# Patient Record
Sex: Female | Born: 1940 | Race: White | Hispanic: No | Smoking: Former smoker
Health system: Southern US, Community
[De-identification: ages and names within clinical notes are randomized; demographics above are authoritative.]

## PROBLEM LIST (undated history)

## (undated) ENCOUNTER — Emergency Department: Admission: EM | Disposition: A | Discharge status: Home / Self Care | Payer: Medicare Other | Source: Home / Self Care

## (undated) DIAGNOSIS — K219 Gastro-esophageal reflux disease without esophagitis: Secondary | ICD-10-CM

## (undated) DIAGNOSIS — D649 Anemia, unspecified: Secondary | ICD-10-CM

## (undated) DIAGNOSIS — M79606 Pain in leg, unspecified: Secondary | ICD-10-CM

## (undated) DIAGNOSIS — I6529 Occlusion and stenosis of unspecified carotid artery: Secondary | ICD-10-CM

## (undated) DIAGNOSIS — F419 Anxiety disorder, unspecified: Secondary | ICD-10-CM

## (undated) DIAGNOSIS — I639 Cerebral infarction, unspecified: Secondary | ICD-10-CM

## (undated) DIAGNOSIS — M1990 Unspecified osteoarthritis, unspecified site: Secondary | ICD-10-CM

## (undated) DIAGNOSIS — I8390 Asymptomatic varicose veins of unspecified lower extremity: Secondary | ICD-10-CM

## (undated) DIAGNOSIS — E785 Hyperlipidemia, unspecified: Secondary | ICD-10-CM

## (undated) HISTORY — DX: Pain in leg, unspecified: M79.606

## (undated) HISTORY — DX: Occlusion and stenosis of unspecified carotid artery: I65.29

## (undated) HISTORY — PX: CHOLECYSTECTOMY: SHX55

## (undated) HISTORY — DX: Gastro-esophageal reflux disease without esophagitis: K21.9

## (undated) HISTORY — DX: Unspecified osteoarthritis, unspecified site: M19.90

## (undated) HISTORY — DX: Asymptomatic varicose veins of unspecified lower extremity: I83.90

## (undated) HISTORY — DX: Hyperlipidemia, unspecified: E78.5

## (undated) HISTORY — DX: Cerebral infarction, unspecified: I63.9

## (undated) HISTORY — DX: Anxiety disorder, unspecified: F41.9

## (undated) HISTORY — DX: Anemia, unspecified: D64.9

---

## 1998-04-28 ENCOUNTER — Emergency Department (HOSPITAL_COMMUNITY): Admission: EM | Admit: 1998-04-28 | Discharge: 1998-04-28 | Payer: Self-pay

## 2008-07-22 ENCOUNTER — Ambulatory Visit: Payer: Self-pay | Admitting: Surgery

## 2008-07-26 ENCOUNTER — Ambulatory Visit: Payer: Self-pay | Admitting: Surgery

## 2008-07-26 ENCOUNTER — Inpatient Hospital Stay (HOSPITAL_COMMUNITY): Admission: RE | Admit: 2008-07-26 | Discharge: 2008-07-27 | Payer: Self-pay | Admitting: Surgery

## 2008-07-26 ENCOUNTER — Encounter: Payer: Self-pay | Admitting: Surgery

## 2008-07-26 HISTORY — PX: CAROTID ENDARTERECTOMY: SUR193

## 2008-08-26 ENCOUNTER — Ambulatory Visit: Payer: Self-pay | Admitting: Surgery

## 2009-03-10 ENCOUNTER — Ambulatory Visit: Payer: Self-pay | Admitting: Surgery

## 2009-06-16 ENCOUNTER — Emergency Department (HOSPITAL_COMMUNITY): Admission: EM | Admit: 2009-06-16 | Discharge: 2009-06-16 | Payer: Self-pay | Admitting: Emergency Medicine

## 2010-07-20 ENCOUNTER — Ambulatory Visit: Payer: Self-pay | Admitting: Surgery

## 2010-11-04 ENCOUNTER — Encounter: Payer: Self-pay | Admitting: Vascular Surgery

## 2010-12-02 ENCOUNTER — Encounter (INDEPENDENT_AMBULATORY_CARE_PROVIDER_SITE_OTHER): Payer: Medicare Other | Admitting: Vascular Surgery

## 2010-12-02 ENCOUNTER — Encounter (INDEPENDENT_AMBULATORY_CARE_PROVIDER_SITE_OTHER): Payer: Medicare Other

## 2010-12-02 DIAGNOSIS — I83893 Varicose veins of bilateral lower extremities with other complications: Secondary | ICD-10-CM

## 2010-12-03 NOTE — Assessment & Plan Note (Signed)
OFFICE VISIT  [name], [name] DOB:  30-Jan-1941                                       12/02/2010 WUXLK#:[id number]  [name] presents today for evaluation of right leg venous hypertension.  This is a very pleasant 70 year old white female with progressively severe venous varicosities and pain in her right leg over the last several years.  She is known to our practice from a prior carotid endarterectomy by Dr. Durene Cal.  She has been wearing thigh high graduated compression garments since October 2011.  She reports that she has had no improvement in her symptoms.  She does report aching and pain with prolonged standing most over the veins in her medial thigh and calf and also a heavy sensation in her leg below the knee in general.  She does have swelling at the level of her right ankle and calf.  She does not have a history of DVT or superficial thrombophlebitis.  She does have no relief with elevation.  She does have leg  discomfort.  PAST MEDICAL HISTORY:  Significant for hypertension and diabetes.  SOCIAL HISTORY:  She is married with 4 children.  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  REVIEW OF SYSTEMS:  No weight loss or gain.  She is 5 feet tall.  She weighs 150 pounds.  She does have a history of asthma and has history of arthritis, joint pain and muscle pain.  Review of systems otherwise negative.  PHYSICAL EXAMINATION:  Well-developed, well-nourished white female appearing her stated age in no acute distress. Blood pressure is 132/76, pulse 90, respirations 18. HEENT:  Normal. Her radial pulses are 2+.  She has 2+ dorsalis pedis pulses bilaterally. MUSCULOSKELETAL:  Shows no deformity or cyanosis. NEUROLOGIC:  No focal weakness or paresthesias. SKIN:  Without ulcers or rashes.  She does have marked venous varicosities throughout her right thigh and medial calf.  She underwent a formal duplex today in our office and  this reveals gross reflux throughout her right great saphenous vein and significant reflux of her deep system.  These do extend into the varicosities.  I discussed this at length with [name] and her husband present.  I have recommended laser ablation of her right great saphenous vein for reduction of her venous hypertension.  She understands that this is an outpatient procedure under local anesthesia.  She understands that she may require a second stage procedure for the varicosities if she does not have relief with the ablation of her saphenous vein and she wish to proceed.  We will schedule this at her earliest convenience.    Larina Earthly, M.D. Electronically Signed  TFE/MEDQ  D:  12/02/2010  T:  12/03/2010  Job:  [id number]

## 2010-12-09 NOTE — Procedures (Unsigned)
LOWER EXTREMITY VENOUS REFLUX EXAM  INDICATION:  Varicose veins.  EXAM:  Using color-flow imaging and pulse Doppler spectral analysis, the right and left common femoral, superficial femoral, popliteal, posterior tibial, greater and lesser saphenous veins are evaluated.  There is evidence suggesting deep venous insufficiency in the right and left lower extremities.  The right and left saphenofemoral junctions are not competent with Reflux of >500 milliseconds. The right GSV is not competent with Reflux of >500 milliseconds with the caliber as described below.  The left GSV is competent.  The right and left proximal short saphenous veins demonstrate competency.  GSV Diameter (used if found to be incompetent only)                                           Right    Left Proximal Greater Saphenous Vein           1.03 cm  0.59 cm Proximal-to-mid-thigh                     0.66 cm Mid thigh                                 0.60 cm Mid-distal thigh Distal thigh                              0.26 cm Knee   IMPRESSION: 1. Right greater saphenous vein is not competent with reflux     >561milliseconds. 2. The left greater saphenous vein is competent. 3. The right and left great saphenous veins are not tortuous. 4. The deep venous system is not competent with Reflux of     >515milliseconds. 5. The right and left small saphenous veins are competent.        ___________________________________________ Larina Earthly, M.D.  OD/MEDQ  D:  12/03/2010  T:  12/03/2010  Job:  [id number]

## 2010-12-16 ENCOUNTER — Other Ambulatory Visit (INDEPENDENT_AMBULATORY_CARE_PROVIDER_SITE_OTHER): Payer: Medicare Other | Admitting: Vascular Surgery

## 2010-12-16 DIAGNOSIS — I83893 Varicose veins of bilateral lower extremities with other complications: Secondary | ICD-10-CM

## 2010-12-17 NOTE — Assessment & Plan Note (Signed)
OFFICE VISIT  [name], [name] DOB:  [birth year]                                       12/16/2010 ZOXWR#:[id number]  The patient underwent laser ablation of her right great saphenous vein from mid thigh to just below the saphenofemoral junction.  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  This was done under tumescent anesthesia with no immediate complications.  She was discharged to home and will be seen again in 1 week for follow-up duplex.    Larina Earthly, M.D. Electronically Signed  TFE/MEDQ  D:  12/16/2010  T:  12/17/2010  Job:  [id number]

## 2010-12-23 ENCOUNTER — Ambulatory Visit (INDEPENDENT_AMBULATORY_CARE_PROVIDER_SITE_OTHER): Payer: Medicare Other | Admitting: Vascular Surgery

## 2010-12-23 ENCOUNTER — Encounter (INDEPENDENT_AMBULATORY_CARE_PROVIDER_SITE_OTHER): Payer: Medicare Other

## 2010-12-23 DIAGNOSIS — I87399 Chronic venous hypertension (idiopathic) with other complications of unspecified lower extremity: Secondary | ICD-10-CM

## 2010-12-23 DIAGNOSIS — I83893 Varicose veins of bilateral lower extremities with other complications: Secondary | ICD-10-CM

## 2010-12-23 DIAGNOSIS — Z48812 Encounter for surgical aftercare following surgery on the circulatory system: Secondary | ICD-10-CM

## 2010-12-24 NOTE — Assessment & Plan Note (Signed)
OFFICE VISIT  [name], WESTGATE DOB:  June 11, 1941                                       12/23/2010 [id number]#:[id number]  Patient presents today for 1-week followup of her right great saphenous vein laser ablation.  She has the usual mild discomfort over the ablation site in the medial thigh, has had no increased swelling and no other difficulties.  PHYSICAL EXAMINATION:  Blood pressure 137/80, pulse 93, respirations 20. HEENT is normal.  Musculoskeletal:  No major deformities or cyanosis. Neurologic:  No focal weakness, no paresthesias.  Skin without ulcers or rashes.  She does have some thickening in the right medial thigh over the ablation site.  She does have a palpable dorsalis pedis pulse.  She then underwent noninvasive vascular laboratory studies in our office, which I have reviewed with her.  This shows no evidence of DVT and does show ablation over her great saphenous vein from the distal insertion site to just below the saphenofemoral junction.  I am quite pleased with her Jagger Demonte result, as is patient.  She certainly has had decompression of the large varices that she had in her medial thigh and calf.  She will continue use of her compression garment as needed.  We will see her again in 3 months for continued discussion. She understands that she may require a separate staged phlebectomy of tributaries should she continue to have discomfort related to this.    Larina Earthly, M.D. Electronically Signed  TFE/MEDQ  D:  12/23/2010  T:  12/24/2010  Job:  [id number]  cc:   Dr. Blane Ohara

## 2010-12-25 LAB — CBC
HCT: 36.3 % (ref 36.0–46.0)
Hemoglobin: 12.1 g/dL (ref 12.0–15.0)
MCHC: 33.5 g/dL (ref 30.0–36.0)
Platelets: 395 10*3/uL (ref 150–400)
RDW: 13.2 % (ref 11.5–15.5)

## 2010-12-25 LAB — DIFFERENTIAL
Basophils Absolute: 0.1 10*3/uL (ref 0.0–0.1)
Basophils Relative: 1 % (ref 0–1)
Eosinophils Relative: 3 % (ref 0–5)
Lymphocytes Relative: 28 % (ref 12–46)
Lymphs Abs: 2.7 10*3/uL (ref 0.7–4.0)
Monocytes Absolute: 0.5 10*3/uL (ref 0.1–1.0)
Neutro Abs: 6.2 10*3/uL (ref 1.7–7.7)
Neutrophils Relative %: 63 % (ref 43–77)

## 2010-12-25 LAB — BASIC METABOLIC PANEL
CO2: 28 mEq/L (ref 19–32)
Creatinine, Ser: 0.54 mg/dL (ref 0.4–1.2)
GFR calc non Af Amer: >60 mL/min (ref >60)

## 2010-12-25 LAB — POCT CARDIAC MARKERS
CKMB, poc: <1 ng/mL — ABNORMAL LOW (ref 1.0–8.0)
Myoglobin, poc: 65 ng/mL (ref 12–200)
Troponin i, poc: <0.05 ng/mL (ref 0.00–0.09)

## 2010-12-29 NOTE — Procedures (Unsigned)
DUPLEX DEEP VENOUS EXAM - LOWER EXTREMITY  INDICATION:  Right greater saphenous vein laser ablation.  HISTORY:  Edema:  No. Trauma/Surgery:  Right greater saphenous vein laser ablation on 12/16/2010. Pain:  Right thigh. PE:  No. Previous DVT:  No. Anticoagulants: Other:  DUPLEX EXAM:               CFV   SFV   PopV  PTV    GSV               R  L  R  L  R  L  R   L  R  L Thrombosis    o  o  o     o     o      + Spontaneous   +  +  +     +     +      o Phasic        +  +  +     +     +      o Augmentation  +  +  +     +     +      o Compressible  +  +  +     +     +      o Competent     +  +  +     +     +      o  Legend:  + - yes  o - no  p - partial  D - decreased   IMPRESSION: 1. No evidence of deep venous thrombosis noted in the right lower     extremity. 2. The right greater saphenous vein appears to be totally occluded     from the distal insertion site to the lateral accessory saphenous     vein. 3. No reflux is noted in the right saphenofemoral junction.       _____________________________ Larina Earthly, M.D.  CH/MEDQ  D:  12/23/2010  T:  12/23/2010  Job:  [id number]

## 2011-02-02 NOTE — Procedures (Signed)
CAROTID DUPLEX EXAM   INDICATION:  Followup left carotid endarterectomy.   HISTORY:  Diabetes:  Yes.  Cardiac:  No.  Hypertension:  No.  Smoking:  Previous.  Previous Surgery:  Left carotid endarterectomy on 07/26/2008.  CV History:  Currently asymptomatic.  Amaurosis Fugax No, Paresthesias No, Hemiparesis No                                       RIGHT             LEFT  Brachial systolic pressure:         156               158  Brachial Doppler waveforms:         Normal            Normal  Vertebral direction of flow:        Antegrade         Antegrade  DUPLEX VELOCITIES (cm/sec)  CCA peak systolic                   96                76  ECA peak systolic                   73                93  ICA peak systolic                   84                148  ICA end diastolic                   34                46  PLAQUE MORPHOLOGY:  PLAQUE AMOUNT:                      None              None  PLAQUE LOCATION:   IMPRESSION:  1. No evidence of stenosis noted in the right internal carotid artery.  2. Patent left carotid endarterectomy site with a mildly increased      velocity noted in the left proximal internal carotid artery,      however, no plaque formation is visualized.  3. Mild increase in velocity of the left internal carotid artery noted      when compared to the previous exam on 03/10/2009 with the right      internal carotid artery remaining stable.   ___________________________________________  V. Charlena Cross, MD   CH/MEDQ  D:  07/20/2010  T:  07/20/2010  Job:  [id number]

## 2011-02-02 NOTE — Assessment & Plan Note (Signed)
OFFICE VISIT   MELIDA, NORTHINGTON  DOB:  [birth year]                                       07/20/2010  [id number]#:[id number]   Patient comes back today for follow-up of her carotid disease.  She is  status post left carotid endarterectomy with patch angioplasty on  07/26/2008 for symptomatic stenosis.  She does not endorse any recurrent  symptoms.  She does complain of a firmness in her incision site, which  she feels is scar tissue   Her biggest complaint today is that of her legs.  She complains of  heaviness and pain as well as swelling in both legs, her right leg being  greater than the left.   PHYSICAL EXAMINATION:  Heart rate 87, blood pressure 115/74, O2 sat 97%.  General:  She is well-appearing in no distress.  HEENT:  Within normal  limits.  Her carotid incision on the left is well-healed.  The bulge she  is feeling is scar tissue.  She is neurologically intact.  Extremities:  The patient has bilateral lower extremity edema.  She has bulging  varicosities in the greater saphenous distribution above and below the  knee, which are very tender and particularly to the touch.  These have  been bothering her for many years but have gotten worse.   PLAN:  1. Carotid disease.  The patient will come back in 1 year for follow-      up ups.  The ultrasound was performed today which showed bilateral      patent carotid arteries without significant stenosis.  2. Lower extremity swelling:  I believe the patient has venous      insufficiency bilaterally, right leg greater than left, as she has      prominent varicosities on the right.  She does complain of swelling      as well as pain in her legs over the site of her varicosities.  I      am going to place her in compression stockings beginning today.      She already has an appointment to see Dr. Hart Rochester in December for      evaluation of her venous insufficiency.  I am going to begin the      process of her leg  compression today.  I am also ordering a venous      insufficiency ultrasound for when she comes back to see [place] in      December.     Jorge Ny, MD  Electronically Signed   [name]/MEDQ  D:  07/20/2010  T:  07/21/2010  Job:  [id number]   cc:   Blane Ohara

## 2011-02-02 NOTE — Assessment & Plan Note (Signed)
OFFICE VISIT   [name], [name]  DOB:  1940-10-20                                       07/22/2008  EAVWU#:[id number]   REASON FOR VISIT:  Evaluate left carotid stenosis.   HISTORY:  This is a 70 year old female I am seeing at the request of Dr.  Sedalia Velez for evaluation of left carotid stenosis.  The patient has recently  been seen with a concern about memory loss.  She stated that she does  have a history of an episode about 10 years ago where she had total  right body numbness and paralysis which lasted approximately 1 week and  then subsided.  She does still complain of occasional right-sided  numbness.  She has had some occasional trouble swallowing.  She denies  having any amaurosis fugax but does have blurry vision.  She was sent  for carotid duplex testing which revealed near occlusion of her left  carotid artery and she comes in today for further evaluation.   Patient is a diabetic.  Her most recent hemoglobin A1c was 7.  She has  never had a heart attack and has never had chest pain.  Her most recent  Cardiolite was in 2007, which was normal.  She has also had recent  pulmonary spirometry which was within normal limits.  She does have  hypercholesterolemia for which she is on medications for, but is having  trouble getting it under control.   REVIEW OF SYSTEMS:  GENERAL:  Negative for fevers, chills, weight gain,  weight loss.  CARDIAC:  Positive for shortness of breath when lying flat, shortness of  breath with exertion.  PULMONARY:  Negative.  GI:  Positive for reflux.  GU:  Negative.  VASCULAR:  Positive for history of stroke as well as TIA.  NEURO:  Negative.  ORTHO:  Positive for arthritis, joint pain, muscle pain.  PSYCH:  Negative.  ENT:  Negative.  HEME:  Positive for anemia.   PAST MEDICAL HISTORY:  Diabetes since age 38, hypercholesterolemia,  history of stroke, memory loss.   PAST SURGICAL HISTORY:  Cholecystectomy and bladder  tack.   FAMILY HISTORY:  Positive for stroke and heart disease.   SOCIAL HISTORY:  She is married with 5 children.  Does not smoke.  Has a  history smoking, quit at age 71.  Does not drink alcohol on a regular  basis.   MEDICATIONS:  Zolpidem 10 mg daily, Nexium 40 mg per day, Lipitor 40 mg  per day, glipizide 10 mg daily, metformin 500 mg b.i.d., aspirin 81 mg  per day.   ALLERGIES:  None.   PHYSICAL EXAMINATION:  Vital Signs:  Blood pressure is 142/77, pulse 78,  temperature is 97.8.  General:  She is well-appearing, no distress.  HEENT:  Normocephalic, atraumatic.  Pupils equal.  Sclerae anicteric.  Neck:  Supple, no JVD.  She does have a left carotid bruit.  Cardiovascular:  Regular rate and rhythm.  No murmurs, rubs or gallops.  Pulmonary:  Lungs are clear bilaterally.  Abdomen:  Soft, nontender,  nondistended.  No hepatosplenomegaly, no pulsatile mass.  Extremities:  Warm and well-perfused.  Neuro:  Cranial nerves II-XII are grossly  intact.  No focal deficits.  Psych:  She is alert and oriented x3.   DIAGNOSTIC STUDIES:  The patient had a carotid duplex performed in the  office today which reveals 80-99% left internal carotid stenosis with  end-diastolic velocities of 305.  Her anatomy is normal.  There is  normal internal carotid artery pass the stenosis, the location of the  bifurcation is mid hyoid.   ASSESSMENT/PLAN:  Symptomatic left carotid stenosis.   PLAN:  Patient will be scheduled for a left carotid endarterectomy to be  performed this Friday, November 6th.  The risks and benefits of the  procedure were discussed with the patient.  We discussed the risks of  stroke, nerve injury, bleeding, cardiac complications, pulmonary  complications, infection.  She understands these risks and wishes to  proceed, I will keep her on her aspirin.  I am not going to send her for  cardiac testing, given that she is symptomatic from her carotid and does  not complain of chest  pain.  She has a normal cardiac study 2 years ago.  She has a known left bundle branch block on EKG.   Glenda Ny, MD  Electronically Signed   [name]/MEDQ  D:  07/22/2008  T:  07/23/2008  Job:  [id number]   cc:   Mickey Farber, Dr.

## 2011-02-02 NOTE — Op Note (Signed)
NAMEGIAVANNI, [name]                   ACCOUNT NO.:  [id number]   MEDICAL RECORD NO.:  [id number]          PATIENT TYPE:  INP   LOCATION:  2899                         FACILITY:  [facility]   PHYSICIAN:  Juleen China IV, MDDATE OF BIRTH:  04-06-1941   DATE OF PROCEDURE:  07/26/2008  DATE OF DISCHARGE:                               OPERATIVE REPORT   PREOPERATIVE DIAGNOSIS:  Symptomatic left carotid stenosis.   POSTOPERATIVE DIAGNOSIS:  Symptomatic left carotid stenosis.   PROCEDURE PERFORMED:  Left carotid endarterectomy with patch  angioplasty.   SURGEON:  1. Charlena Cross, MD   ASSISTANT:  Zenaida Niece.   ANESTHESIA:  General.   BLOOD LOSS:  100 mL.   FINDINGS:  80% stenosis.   DRAINS:  None.   CULTURES:  None.   SPECIMENS:  Plaque.   COMPLICATIONS:  None.   INDICATIONS:  This is a 70 year old female who presented to my office  having had a remote history of right-sided weakness.  She has recently  been suffering from memory loss.  She has occasional difficulty  swallowing.  Her symptoms were vague, but could potentially be due to  carotid disease along.  In addition, she had history of TIA about a  decade ago.  She was found to have high grade left carotid stenosis  therefore decision was made to proceed with left carotid endarterectomy.   PROCEDURE:  The patient was identified in the holding area and taken to  room.  She was placed supine on the table.  General endotracheal  anesthesia was  administered.  The patient's left neck and chest were  prepped and draped in the standard sterile fashion.  A time-out was  called and antibiotics were given.  Incision was made along the anterior  border of the sternocleidomastoid.  Cautery was used to dissect through  the subcutaneous tissue.  The platysma muscle was divided with cautery.  The internal jugular vein was mobilized along its anterior medial  surface and retracted laterally.  The common facial vein was  identified  and divided between 2-0 silk ties.  Next, the carotid sheath was entered  sharply.  The common carotid artery was mobilized proximally and  distally and encircled with Vesseloops. The vagus nerve was preserved.  Moving cephalad the superior thyroid artery was identified and encircled  with 2-0 silk tie.  The external carotid artery was mobilized and  encircled with a blue loop.  Began mobilizing the internal carotid  artery.  The hypoglossal nerve was identified.  In order to get above  the disease, the hypoglossal nerve had to be immobilized.  Once  hypoglossal was preserved and out of the way, I was able to get to the  distal internal carotid artery, which was free of disease.  Umbilical  tape was placed here.  At this point in time, the patient was given  systemic heparinization.  After 2 minutes of circulation time, internal  carotid artery was clamped as well as the external and common carotid  arteries.  A #11 blade was used to make an arteriotomy, which  was  extended with Potts scissors on the anterior lateral surface of the  internal carotid artery.  A 10-French whistle tip shunt was then placed.  Next, endarterectomy was performed using a Runner, broadcasting/film/video.  Eversion  endarterectomy was performed to the external carotid artery.  A good  endpoint was obtained in the internal carotid artery.  The  endarterectomized plane was then irrigated with heparinized saline.  All  potential embolic debris were removed.  At this point in time we set up  for patch repair.  A bovine pericardial patch was selected.  Patch  angioplasty was performed using running 6-0 Prolene.  Prior to  completion of patch, the shunt was removed.  The internal, external, and  common carotid arteries were flushed appropriately.  The anastomosis was  then secured.  The clamp on the external carotid artery was released.  Several repair stitches were placed.  The common carotid clamp was then  released and  after 30 seconds the internal clamp was released.  Doppler  was used to evaluate the signal in the internal common, external carotid  arteries all of which had good signals.  Next, 50 mg of protamine were  given.  Once hemostasis was achieved the carotid sheath was  reapproximated with interrupted 3-0 Vicryl.  The platysma muscle was  then closed with the running 3-0 Vicryl and the skin was closed with a 4-  0 Vicryl.  The patient was then successfully awakened from anesthesia.  She was found to be moving all 4 extremities to command.  She was taken  to recovery room in stable condition.           ______________________________  V. Charlena Cross, MD  Electronically Signed     [name]/MEDQ  D:  07/26/2008  T:  07/27/2008  Job:  [id number]

## 2011-02-02 NOTE — Procedures (Signed)
CAROTID DUPLEX EXAM   INDICATION:  Left carotid endarterectomy.   HISTORY:  Diabetes:  Yes.  Cardiac:  No.  Hypertension:  No.  Smoking:  Previous.  Previous Surgery:  Left carotid endarterectomy on 07/26/2008.  CV History:  Currently asymptomatic, history of left eye amaurosis fugax  and right upper extremity numbness prior to surgery.  Amaurosis Fugax No, Paresthesias No, Hemiparesis No.                                       RIGHT             LEFT  Brachial systolic pressure:         142               144  Brachial Doppler waveforms:         Normal            Normal  Vertebral direction of flow:        Antegrade         Antegrade  DUPLEX VELOCITIES (cm/sec)  CCA peak systolic                   71                130  ECA peak systolic                   125               106  ICA peak systolic                   76                108  ICA end diastolic                   30                33  PLAQUE MORPHOLOGY:  PLAQUE AMOUNT:                      None              None  PLAQUE LOCATION:   IMPRESSION:  1. No evidence of stenosis noted in the bilateral internal carotid      arteries.  2. Significant improvement of the left internal carotid artery when      compared to the previous exam on 07/22/2008 with the right internal      carotid artery remaining stable.   ___________________________________________  V. Charlena Cross, MD   CH/MEDQ  D:  03/10/2009  T:  03/10/2009  Job:  [id number]

## 2011-02-02 NOTE — Assessment & Plan Note (Signed)
OFFICE VISIT   [name], [name]  DOB:  07/31/1941                                       08/26/2008  ZOXWR#:[id number]   HISTORY:  This is a 70 year old female with possibly symptomatic left  carotid stenosis.  She underwent left carotid endarterectomy performed  on July 26, 2008.  Operative findings included an 80% stenosis  without thrombus.  The patient's postoperative course was uneventful.  She comes back in today for follow-up, she is doing very well at this  time.  Her incision has completely healed.  She has no neurologic  defects.  The patient will come back for a follow-up duplex and  ultrasound in 6 months.  She will be placed on our carotid ultrasound  protocol.   Jorge Ny, MD  Electronically Signed   [name]/MEDQ  D:  08/26/2008  T:  08/27/2008  Job:  [id number]   cc:   Blane Ohara, M.D.

## 2011-02-02 NOTE — Procedures (Signed)
CAROTID DUPLEX EXAM   INDICATION:  Follow-up evaluation of known carotid artery disease.   HISTORY:  Diabetes:  Yes.  Cardiac:  No.  Hypertension:  No.  Smoking:  Former smoker, quit in 1995.  Previous Surgery:  No.  CV History:  Dysphagia, memory loss, right arm numbness, left eye  amaurosis fugax.  Amaurosis Fugax Yes, Paresthesias Yes, Hemiparesis Yes.                                       RIGHT             LEFT  Brachial systolic pressure:         146               150  Brachial Doppler waveforms:         Triphasic         Triphasic  Vertebral direction of flow:        Antegrade         Antegrade  DUPLEX VELOCITIES (cm/sec)  CCA peak systolic                   84                77  ECA peak systolic                   90                96  ICA peak systolic                   86                533  ICA end diastolic                   35                305  PLAQUE MORPHOLOGY:                  None              Soft  PLAQUE AMOUNT:                      None              Severe  PLAQUE LOCATION:                    None              Proximal ICA   IMPRESSION:  1. No right ICA stenosis.  2. An 80-99% left ICA stenosis.        ___________________________________________  V. Charlena Cross, MD   MC/MEDQ  D:  07/22/2008  T:  07/22/2008  Job:  [id number]

## 2011-02-05 NOTE — Discharge Summary (Signed)
[name], [name]                   ACCOUNT NO.:  [id number]   MEDICAL RECORD NO.:  [id number]          PATIENT TYPE:  INP   LOCATION:  3304                         FACILITY:  [facility]   PHYSICIAN:  Juleen China IV, MDDATE OF BIRTH:  1940/11/14   DATE OF ADMISSION:  07/26/2008  DATE OF DISCHARGE:  07/27/2008                               DISCHARGE SUMMARY   FINAL DISCHARGE DIAGNOSES:  1. Symptomatic left carotid stenosis.  2. Dyslipidemia.  3. Hypertension.  4. History of cerebrovascular accident.   PROCEDURE PERFORMED:  Left carotid endarterectomy with patch angioplasty  by Dr. Myra Gianotti on July 26, 2008.   COMPLICATIONS:  None.   CONDITION ON DISCHARGE:  Stable, improving.   DISCHARGE MEDICATIONS:  She is instructed to resume all previous  medications consisting of:  1. Zolpidem 10 mg p.o. daily.  2. Aspirin 81 mg p.o. daily.  3. Nexium 40 mg p.o. daily.  4. Metformin 500 mg p.o. b.i.d.  5. Lipitor 40 mg p.o. daily.  6. Glipizide 10 mg p.o. daily.  7. __________.   She is also given a prescription for Tylox 1-2 tablets p.o. q.4-6 h.  p.r.n. pain.   DISPOSITION:  She is being discharged home in stable condition with her  wounds healing well.  She is given careful instructions regarding the  care of her wounds and her activity level.  Arrangements were made  already to see Dr. Myra Gianotti in 2-3 weeks.  The office will arrange the  visit.   BRIEF IDENTIFYING STATEMENT:  For complete details, please refer the  typed history and physical.  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  She was evaluated by Dr. Myra Gianotti who recommended right  carotid endarterectomy for stroke prevention.  She was informed of the  risks and benefits of the procedure and after careful consideration,  elected to proceed with surgery.   HOSPITAL COURSE:  Preoperative workup was completed as an outpatient.  She was brought  in through Same-Day Surgery and underwent the  aforementioned left carotid endarterectomy.  For complete details,  please refer the typed operative report.  Procedure was without  complications, and she was returned to the Postanesthesia Care Unit  extubated.  Following stabilization, she was transferred to a bed on a  Surgical Step-Down Unit.  She was observed  overnight.  The following morning, she was neurologically intact.  She  did have a left marginal mandibular nerve palsy, which should improve  over the next several weeks.  She was desirous of discharge and was  discharged home in stable condition.      Wilmon Arms, PA      V. Charlena Cross, MD  Electronically Signed    KEL/MEDQ  D:  09/16/2008  T:  09/16/2008  Job:  [id number]

## 2011-03-30 ENCOUNTER — Ambulatory Visit (INDEPENDENT_AMBULATORY_CARE_PROVIDER_SITE_OTHER): Payer: Medicare Other | Admitting: Vascular Surgery

## 2011-03-30 DIAGNOSIS — I83893 Varicose veins of bilateral lower extremities with other complications: Secondary | ICD-10-CM

## 2011-03-30 NOTE — Assessment & Plan Note (Signed)
OFFICE VISIT  [name], [name] DOB:  1941-01-27                                       03/30/2011 ZOXWR#:[id number]  Patient presents today for a 57-month followup after laser ablation of her right great saphenous vein on 12/16/10.  She has no discomfort in her legs and has complete resolution of symptoms of varicosities.  Her medical history is otherwise unchanged.  She denies any pain, is not wearing her compressions, and she is not having any pain, is not having any swelling.  PHYSICAL EXAMINATION:  Blood pressure is 152/83, pulse 77, respirations 16.  HEENT is normal.  She does have palpable pedal pulses bilaterally. She has had complete resolution of the large varicosities in her right medial thigh and right medial calf with no evidence of venous hypertension.  I am quite pleased with her result.  She understands that she has had a good result from ablation only.  There was some consideration she may have to have staged stab phlebectomies but currently she is having no enlargement of these and certainly would not recommend any treatment. She understands this could progress over time, and she will notify us should this occur, otherwise we will see her on a p.r.n. basis.    Larina Earthly, M.D. Electronically Signed  TFE/MEDQ  D:  03/30/2011  T:  03/30/2011  Job:  [id number]

## 2011-06-06 ENCOUNTER — Emergency Department (HOSPITAL_COMMUNITY): Payer: Medicare Other

## 2011-06-06 ENCOUNTER — Emergency Department (HOSPITAL_COMMUNITY)
Admission: EM | Admit: 2011-06-06 | Discharge: 2011-06-06 | Disposition: A | Discharge status: Nursing Home (Includes ICF) | Payer: Medicare Other | Source: Home / Self Care | Attending: Emergency Medicine | Admitting: Emergency Medicine

## 2011-06-06 ENCOUNTER — Inpatient Hospital Stay (HOSPITAL_COMMUNITY)
Admission: EM | Admit: 2011-06-06 | Discharge: 2011-06-08 | DRG: 069 | Disposition: A | Discharge status: Home / Self Care | Payer: Medicare Other | Attending: Internal Medicine | Admitting: Internal Medicine

## 2011-06-06 DIAGNOSIS — I2510 Atherosclerotic heart disease of native coronary artery without angina pectoris: Secondary | ICD-10-CM | POA: Diagnosis present

## 2011-06-06 DIAGNOSIS — E119 Type 2 diabetes mellitus without complications: Secondary | ICD-10-CM | POA: Diagnosis present

## 2011-06-06 DIAGNOSIS — I447 Left bundle-branch block, unspecified: Secondary | ICD-10-CM | POA: Diagnosis present

## 2011-06-06 DIAGNOSIS — I10 Essential (primary) hypertension: Secondary | ICD-10-CM | POA: Diagnosis present

## 2011-06-06 DIAGNOSIS — G459 Transient cerebral ischemic attack, unspecified: Principal | ICD-10-CM | POA: Diagnosis present

## 2011-06-06 DIAGNOSIS — R079 Chest pain, unspecified: Secondary | ICD-10-CM

## 2011-06-06 DIAGNOSIS — R0789 Other chest pain: Secondary | ICD-10-CM | POA: Diagnosis present

## 2011-06-06 DIAGNOSIS — D72829 Elevated white blood cell count, unspecified: Secondary | ICD-10-CM | POA: Diagnosis present

## 2011-06-06 DIAGNOSIS — E785 Hyperlipidemia, unspecified: Secondary | ICD-10-CM | POA: Diagnosis present

## 2011-06-06 DIAGNOSIS — R7989 Other specified abnormal findings of blood chemistry: Secondary | ICD-10-CM

## 2011-06-06 LAB — GLUCOSE, CAPILLARY: Glucose-Capillary: 157 mg/dL — ABNORMAL HIGH (ref 70–99)

## 2011-06-06 LAB — DIFFERENTIAL
Basophils Absolute: 0.1 10*3/uL (ref 0.0–0.1)
Basophils Relative: 1 % (ref 0–1)
Eosinophils Absolute: 0.3 10*3/uL (ref 0.0–0.7)
Eosinophils Absolute: 0.3 10*3/uL (ref 0.0–0.7)
Eosinophils Relative: 2 % (ref 0–5)
Eosinophils Relative: 2 % (ref 0–5)
Lymphocytes Relative: 27 % (ref 12–46)
Lymphocytes Relative: 22 % (ref 12–46)
Lymphs Abs: 3.4 10*3/uL (ref 0.7–4.0)
Lymphs Abs: 2.7 10*3/uL (ref 0.7–4.0)
Monocytes Absolute: 0.8 10*3/uL (ref 0.1–1.0)
Monocytes Relative: 6 % (ref 3–12)
Neutro Abs: 8.2 10*3/uL — ABNORMAL HIGH (ref 1.7–7.7)
Neutro Abs: 8.4 10*3/uL — ABNORMAL HIGH (ref 1.7–7.7)
Neutrophils Relative %: 65 % (ref 43–77)

## 2011-06-06 LAB — APTT: aPTT: 32 seconds (ref 24–37)

## 2011-06-06 LAB — BASIC METABOLIC PANEL
BUN: 14 mg/dL (ref 6–23)
GFR calc Af Amer: >60 mL/min (ref >60)
GFR calc non Af Amer: >60 mL/min (ref >60)
Glucose, Bld: 121 mg/dL — ABNORMAL HIGH (ref 70–99)
Potassium: 3.8 mEq/L (ref 3.5–5.1)
Sodium: 132 mEq/L — ABNORMAL LOW (ref 135–145)

## 2011-06-06 LAB — PROTIME-INR
INR: 0.91 (ref 0.00–1.49)
Prothrombin Time: 12.4 seconds (ref 11.6–15.2)

## 2011-06-06 LAB — CBC
HCT: 38.6 % (ref 36.0–46.0)
Hemoglobin: 12.9 g/dL (ref 12.0–15.0)
Hemoglobin: 12.3 g/dL (ref 12.0–15.0)
MCH: 30.7 pg (ref 26.0–34.0)
MCH: 31.5 pg (ref 26.0–34.0)
MCHC: 33.4 g/dL (ref 30.0–36.0)
MCV: 91.9 fL (ref 78.0–100.0)
MCV: 91 fL (ref 78.0–100.0)
Platelets: 405 10*3/uL — ABNORMAL HIGH (ref 150–400)
Platelets: 386 10*3/uL (ref 150–400)
RBC: 4.2 MIL/uL (ref 3.87–5.11)
RDW: 12.7 % (ref 11.5–15.5)
RDW: 12.7 % (ref 11.5–15.5)
WBC: 12.8 10*3/uL — ABNORMAL HIGH (ref 4.0–10.5)
WBC: 12.3 10*3/uL — ABNORMAL HIGH (ref 4.0–10.5)

## 2011-06-06 LAB — COMPREHENSIVE METABOLIC PANEL
ALT: 24 U/L (ref 0–35)
AST: 25 U/L (ref 0–37)
Albumin: 4.7 g/dL (ref 3.5–5.2)
Alkaline Phosphatase: 166 U/L — ABNORMAL HIGH (ref 39–117)
BUN: 13 mg/dL (ref 6–23)
CO2: 23 mEq/L (ref 19–32)
Calcium: 11 mg/dL — ABNORMAL HIGH (ref 8.4–10.5)
Chloride: 94 mEq/L — ABNORMAL LOW (ref 96–112)
Creatinine, Ser: 0.67 mg/dL (ref 0.50–1.10)
GFR calc Af Amer: >60 mL/min (ref >60)
GFR calc non Af Amer: >60 mL/min (ref >60)
Glucose, Bld: 152 mg/dL — ABNORMAL HIGH (ref 70–99)
Potassium: 4.6 mEq/L (ref 3.5–5.1)
Sodium: 130 mEq/L — ABNORMAL LOW (ref 135–145)
Total Bilirubin: 0.6 mg/dL (ref 0.3–1.2)
Total Protein: 8 g/dL (ref 6.0–8.3)

## 2011-06-06 LAB — CK TOTAL AND CKMB (NOT AT ARMC)
CK, MB: 1.6 ng/mL (ref 0.3–4.0)
CK, MB: 2.7 ng/mL (ref 0.3–4.0)
Relative Index: INVALID (ref 0.0–2.5)
Total CK: 85 U/L (ref 7–177)
Total CK: 80 U/L (ref 7–177)

## 2011-06-06 LAB — POCT I-STAT TROPONIN I
Troponin i, poc: 0.13 ng/mL (ref 0.00–0.08)
Troponin i, poc: 0.2 ng/mL (ref 0.00–0.08)

## 2011-06-06 LAB — TROPONIN I: Troponin I: <0.3 ng/mL (ref <0.30)

## 2011-06-07 ENCOUNTER — Inpatient Hospital Stay (HOSPITAL_COMMUNITY): Payer: Medicare Other

## 2011-06-07 ENCOUNTER — Other Ambulatory Visit (HOSPITAL_COMMUNITY): Payer: Medicare Other

## 2011-06-07 DIAGNOSIS — R079 Chest pain, unspecified: Secondary | ICD-10-CM

## 2011-06-07 DIAGNOSIS — R55 Syncope and collapse: Secondary | ICD-10-CM

## 2011-06-07 LAB — CBC
MCHC: 34.4 g/dL (ref 30.0–36.0)
Platelets: 341 10*3/uL (ref 150–400)
RDW: 12.6 % (ref 11.5–15.5)

## 2011-06-07 LAB — DIFFERENTIAL
Basophils Absolute: 0.1 10*3/uL (ref 0.0–0.1)
Basophils Relative: 1 % (ref 0–1)
Eosinophils Relative: 3 % (ref 0–5)
Monocytes Absolute: 0.8 10*3/uL (ref 0.1–1.0)

## 2011-06-07 LAB — COMPREHENSIVE METABOLIC PANEL
ALT: 20 U/L (ref 0–35)
Alkaline Phosphatase: 132 U/L — ABNORMAL HIGH (ref 39–117)
CO2: 26 mEq/L (ref 19–32)
Chloride: 101 mEq/L (ref 96–112)
GFR calc Af Amer: >60 mL/min (ref >60)
GFR calc non Af Amer: >60 mL/min (ref >60)
Glucose, Bld: 115 mg/dL — ABNORMAL HIGH (ref 70–99)
Potassium: 3.5 mEq/L (ref 3.5–5.1)
Sodium: 139 mEq/L (ref 135–145)
Total Protein: 6.9 g/dL (ref 6.0–8.3)

## 2011-06-07 LAB — GLUCOSE, CAPILLARY
Glucose-Capillary: 135 mg/dL — ABNORMAL HIGH (ref 70–99)
Glucose-Capillary: 138 mg/dL — ABNORMAL HIGH (ref 70–99)

## 2011-06-07 LAB — LIPID PANEL
HDL: 39 mg/dL — ABNORMAL LOW (ref >39)
LDL Cholesterol: 57 mg/dL (ref 0–99)

## 2011-06-07 LAB — CARDIAC PANEL(CRET KIN+CKTOT+MB+TROPI)
Relative Index: INVALID (ref 0.0–2.5)
Total CK: 82 U/L (ref 7–177)
Total CK: 72 U/L (ref 7–177)

## 2011-06-07 LAB — PHOSPHORUS: Phosphorus: 4.5 mg/dL (ref 2.3–4.6)

## 2011-06-08 ENCOUNTER — Inpatient Hospital Stay (HOSPITAL_COMMUNITY): Payer: Medicare Other

## 2011-06-08 DIAGNOSIS — G459 Transient cerebral ischemic attack, unspecified: Secondary | ICD-10-CM

## 2011-06-08 LAB — BASIC METABOLIC PANEL
Calcium: 9.5 mg/dL (ref 8.4–10.5)
GFR calc Af Amer: >60 mL/min (ref >60)
GFR calc non Af Amer: >60 mL/min (ref >60)
Glucose, Bld: 159 mg/dL — ABNORMAL HIGH (ref 70–99)
Sodium: 140 mEq/L (ref 135–145)

## 2011-06-08 LAB — URINALYSIS, ROUTINE W REFLEX MICROSCOPIC
Ketones, ur: NEGATIVE mg/dL
Leukocytes, UA: NEGATIVE
Nitrite: NEGATIVE
Protein, ur: NEGATIVE mg/dL
pH: 5 (ref 5.0–8.0)

## 2011-06-08 LAB — PTH, INTACT AND CALCIUM: PTH: 44 pg/mL (ref 14.0–72.0)

## 2011-06-08 LAB — CBC
Hemoglobin: 11.6 g/dL — ABNORMAL LOW (ref 12.0–15.0)
MCH: 31.4 pg (ref 26.0–34.0)
MCHC: 33.8 g/dL (ref 30.0–36.0)
Platelets: 361 10*3/uL (ref 150–400)
RDW: 12.9 % (ref 11.5–15.5)

## 2011-06-09 LAB — PROTEIN ELECTROPH W RFLX QUANT IMMUNOGLOBULINS
Alpha-1-Globulin: 3.5 % (ref 2.9–4.9)
Alpha-2-Globulin: 13.9 % — ABNORMAL HIGH (ref 7.1–11.8)
Gamma Globulin: 12.3 % (ref 11.1–18.8)

## 2011-06-09 LAB — VITAMIN D 1,25 DIHYDROXY: Vitamin D2 1, 25 (OH)2: <8 pg/mL

## 2011-06-10 LAB — PROTEIN ELECTROPH W RFLX QUANT IMMUNOGLOBULINS
Albumin ELP: 60.2 % (ref 55.8–66.1)
Alpha-2-Globulin: 12.5 % — ABNORMAL HIGH (ref 7.1–11.8)
Beta Globulin: 6.3 % (ref 4.7–7.2)
Total Protein ELP: 6.7 g/dL (ref 6.0–8.3)

## 2011-06-17 NOTE — Consult Note (Signed)
NAMEMarland Velez  TIFFONY, KITE NO.:  [id number]  MEDICAL RECORD NO.:  [id number]  LOCATION:  [facility]                         FACILITY:  [facility]  PHYSICIAN:  Thana Farr, MD    DATE OF BIRTH:  1940-12-18  DATE OF CONSULTATION:  06/06/2011 DATE OF DISCHARGE:                                CONSULTATION   Consult called by Dr. Radford Pax.  HISTORY:  [name] is a 70 year old female who was seen last week at [facility], complaints of left-sided weakness.  Unclear what workup revealed at that time, but there is a possibility that the workup may have revealed a stroke.  The patient is unable to give a good history. She did not know her discharge medications.  She reports that she did not return to baseline.  She did feel well enough today to go to church. She went to church at approximately 10.  When she attempted to get up to leave, she was unable to walk secondary to left-sided weakness and numbness and chest pain.  The patient was brought in as a code stroke. Initial NIH stroke scale of 5.  PAST MEDICAL HISTORY: 1. Stroke in the past status post left CEA. 2. Hypercholesterolemia. 3. Diabetes.  MEDICATIONS:  Unknown.  ALLERGIES:  No known drug allergies.  SOCIAL HISTORY:  The patient has no history of alcohol, tobacco, or illicit drug abuse.  PHYSICAL EXAMINATION:  Blood pressure 137/66, heart rate 84, respiratory rate 16, temperature 98.0, O2 sat 100% on room air.  Neurologic exam, on mental status testing, the patient is alert, but tearful.  She can follow commands.  Speech is fluent.  On cranial nerve testing, II visual fields grossly intact, III, IV, VI extraocular movements intact.  V and VII decrease in the left nasolabial fold, but smile symmetric.  VIII grossly intact.  IX and VII positive gag.  XI bilateral shoulder shrug. XII midline tongue extension.  On motor exam, the patient gives 5/5 strength in the right upper extremity.  There is a drift with the  left upper extremity and when the patient extends the arm outwards, her arm drift backwards bending at the elbow.  It drifts slowly until it likely touches her forehead.  The patient has 5-/5 in the right lower extremity.  She is unable to lift the left lower extremity off the bed, but when I lift the leg she is able to maintain the leg in the air. There is decreased sensation in the left upper extremity and left lower extremity to light touch and pinprick.  Deep tendon reflexes are 2+ throughout, 1+ ankle jerks.  Plantars are downgoing bilaterally.  On cerebellar testing, finger-to-nose and heel-to-shin intact.  LABORATORY DATA:  Pending.  CT shows no acute changes.  ASSESSMENT:  [name] is a 70 year old female with history of diabetes, hypercholesterolemia, and past stroke who presents with left-sided weakness and numbness.  It is unclear how much weaker she is than her baseline.  The patient is a very poor historian.  It is also unclear if she has had a recent stroke, but she was admitted for left-sided complaints approximately a week ago at [place].  The patient's symptoms  have quickly improved while she was here in the ED.  At the time of this dictation, the patient was able to lift the left leg off the bed, although still not as high as the right leg.  Due to her improvement in symptoms and unclear history, no t-PA was given.  Does require admission for observation and further necessary workup.  PLAN: 1. MRI of the brain without contrast. 2. We will obtain records from [place] and information on her     medications to determine if med changes are necessary and if there     is any additional workup required.  The patient may have had all     the appropriate testing done other than the repeat MRI. 3. PT/OT.          ______________________________ Thana Farr, MD     LR/MEDQ  D:  06/06/2011  T:  06/06/2011  Job:  [id number]  Electronically Signed by Thana Farr MD  on 06/17/2011 03:52:55 PM

## 2011-06-20 NOTE — H&P (Signed)
NAMESAVINA, OLSHEFSKI NO.:  [id number]  MEDICAL RECORD NO.:  [id number]  LOCATION:  2041                         FACILITY:  [facility]  PHYSICIAN:  Eduard Clos, MDDATE OF BIRTH:  09/16/1941  DATE OF ADMISSION:  06/06/2011 DATE OF DISCHARGE:                             HISTORY & PHYSICAL   PRIMARY CARE PHYSICIAN:  Unassigned.  CHIEF COMPLAINT:  Left lower extremity weakness.  HISTORY OF PRESENT ILLNESS:  A 70 year old female with history of hypertension, diabetes mellitus type 2, hyperlipidemia was at the church around 10 a.m. suddenly felt weak in her left lower extremity when she was trying to stand up, she was unable to move, and hence a code stroke was called.  The patient was brought to the ER.  The patient's symptoms lasted 1 hour in which the symptoms had completely resolved.  CT head was negative.  Neurologist, Dr. Thad Ranger is already aware of the patient.  MRI was ordered and since symptoms have resolved, the patient was admitted for further observation.  At this time, MRA was showing nothing acute.  The patient's point-of-care troponin was positive. Cardiology, Dr. Donnie Aho has already seen the patient as the patient's EKG does show LBBB.  Cycling of cardiac markers has been ordered.  A 2-D echo and stress test has been ordered for tomorrow morning.  The patient presently denies any chest pain.  States her left lower extremity weakness has completely resolved.  Denies any weakness in the upper extremity or the right lower extremity.  Denies any loss of consciousness.  Apparently, little dizzy at the time of the symptoms at 10 a.m., presently is not dizzy.  Denies any nausea, vomiting, any headache, visual symptoms.  Denies any difficulty speaking or swallowing.  Denies any fever, chills, cough or phlegm.  Denies any chest pain, shortness of breath.  Denies any abdominal pain, dysuria, discharge, diarrhea.  The patient was having similar symptoms  of left lower extremity weakness and chest pain week ago wherein she was admitted to [facility]. We do not have the records from there.  PAST MEDICAL HISTORY: 1. Hypertension. 2. Diabetes mellitus type 2. 3. Hyperlipidemia. 4. Has had left-sided carotid endarterectomy 6 years ago. 5. Has had cholecystectomy. 6. Has also had a surgery for ureters.  MEDICATIONS PRIOR TO ADMISSION:  The patient states she takes 2 medicines for diabetes and 1 for blood pressure.  SOCIAL HISTORY:  The patient is married.  Denies smoking cigarettes, drinking alcohol, or using illegal drugs.  FAMILY HISTORY:  Positive for prostate cancer in her father.  ALLERGIES:  No known drug allergies.  REVIEW OF SYSTEMS:  As per history of present illness nothing else significant.  PHYSICAL EXAMINATION:  GENERAL:  The patient examined at bedside not in acute distress. VITAL SIGNS:  Blood pressure is 113/60, pulse 77 per minute, temperature 97.3, respirations 18 per minute, O2 sat is 98% on 2 L. HEENT:  Anicteric.  No pallor.  No facial asymmetry.  Tongue is midline. PERRLA positive.  No neck rigidity.  No discharge from ears, eyes, nose or mouth.  CHEST:  Bilateral air entry present.  No rhonchi, no crepitation. HEART:  S1  and S2 heard. ABDOMEN:  Soft, nontender.  Bowel sounds heard. CNS:  The patient alert, awake and oriented to time, place and person. She is able to move upper and lower extremity 5/5.  No pronator drift. No  dysdiadokinesia.  No ataxia. EXTREMITIES:  Peripheral pulses felt.  No acute ischemic changes, cyanosis, or clubbing.  LABORATORY DATA:  EKG shows normal sinus rhythm with LBBB, heart rate is around 70 beats per minute with no old EKG to compare.  Chest x-ray has been ordered.  CT head without contrast shows no evidence of acute intracranial normality.  MRI of the brain is read preliminary as no acute intracranial infarct, restricted depletion, full report in a.m. CBC WBC is  12.8, hemoglobin is 12.9, hematocrit 38.6, platelets 405. PT/INR is 12.4 and 0.9.  Complete metabolic panel sodium 130, potassium 4.6, chloride 94, carbon dioxide 23, glucose 152, BUN 13, creatinine 0.6, total bilirubin is 0.6, alk phos 166, AST 25, AST 24, total protein 8, albumin 4.7, calcium 11.  CKs 85, MBs 1.6, troponin is less than 0.3, point-of-care troponin is 0.13 and 0.20.  ASSESSMENT: 1. Left lower extremity weakness for further assessment. 2. Positive point-of-care troponin with left bundle branch block.  The     patient is chest pain-free. 3. Hypercalcemia. 4. Mild leukocytosis. 5. History of left-sided carotid endarterectomy 6 years ago. 6. History of diabetes mellitus type 2. 7. History of hypertension.  PLAN:  At this time: 1. Admit the patient to telemetry. 2. For her left lower extremity weakness, at this time MRI is     negative.  We will order MRA, carotid Doppler, 2-D echo, neuro     checks with swallow evaluation. 3. Mildly positive point-of-care troponin.  Dr. Donnie Aho of Cardiologist     has already evaluated the patient.  We are going to cycle cardiac     markers.  The patient is going to get a stress test in a.m. 4. Mild hypercalcemia.  We do not have medications at this time.  We     should review one of the causes for hypercalcemia.  We will check     chest x-ray, we will check a serum intact parathormone, also get     vitamin D levels.  I am going to also get a serum protein     electrophoresis and urine protein electrophoresis.  I am going to     hydrate the patient at this time. 5. Mild leukocytosis, we do not know its exact cause.  The patient is     afebrile.  Does not have cough or fever.     Does not have any dysuria or abdominal pain.  Denies any nausea,     vomiting.  We get UA and chest x-ray and repeat a CBC again in a.m. 6. We need to verify home medication and further recommendation as     condition evolves.     Eduard Clos,  MD     ANK/MEDQ  D:  06/06/2011  T:  06/06/2011  Job:  [id number]  Electronically Signed by Midge Minium MD on 06/20/2011 11:58:39 AM

## 2011-06-23 LAB — URINE MICROSCOPIC-ADD ON

## 2011-06-23 LAB — BASIC METABOLIC PANEL
CO2: 27
Calcium: 8.8
GFR calc Af Amer: >60
GFR calc non Af Amer: >60
Sodium: 138

## 2011-06-23 LAB — COMPREHENSIVE METABOLIC PANEL
CO2: 26
Calcium: 9.5
Creatinine, Ser: 0.57
GFR calc non Af Amer: >60
Glucose, Bld: 185 — ABNORMAL HIGH

## 2011-06-23 LAB — TYPE AND SCREEN
ABO/RH(D): O NEG
Antibody Screen: NEGATIVE

## 2011-06-23 LAB — CBC
Hemoglobin: 11.1 — ABNORMAL LOW
Hemoglobin: 9.5 — ABNORMAL LOW
MCHC: 34.4
MCHC: 34.8
MCV: 93.6
RBC: 2.93 — ABNORMAL LOW
RBC: 3.45 — ABNORMAL LOW
WBC: 12.1 — ABNORMAL HIGH

## 2011-06-23 LAB — GLUCOSE, CAPILLARY
Glucose-Capillary: 141 — ABNORMAL HIGH
Glucose-Capillary: 171 — ABNORMAL HIGH
Glucose-Capillary: 236 — ABNORMAL HIGH

## 2011-06-23 LAB — URINALYSIS, ROUTINE W REFLEX MICROSCOPIC
Bilirubin Urine: NEGATIVE
Ketones, ur: NEGATIVE
Nitrite: NEGATIVE
Urobilinogen, UA: 1
pH: 5.5

## 2011-06-23 LAB — PROTIME-INR: Prothrombin Time: 12.6

## 2011-06-23 LAB — APTT: aPTT: 29

## 2011-06-23 NOTE — Consult Note (Signed)
[name], [name] NO.:  [id number]  MEDICAL RECORD NO.:  [id number]  LOCATION:  2041                         FACILITY:  [facility]  PHYSICIAN:  Georga Hacking, M.D.DATE OF BIRTH:  12-Aug-1941  DATE OF CONSULTATION: 06/06/2011                                CONSULTATION   REASON. FOR CONSULTATION:  Abnormal troponin drawn in the emergency room.  HISTORY:  The patient is a 70 year old female who has a longstanding history of hypertension, diabetes, and hyperlipidemia.  She has had a previous carotid endarterectomy by Dr. Myra Gianotti for symptoms of TIA several years ago.  She has recently had laser ablation of varicose veins also done by Dr. Myra Gianotti earlier this year.  She has had a 3-week history of a constant left-sided chest pain.  It is difficult for her to describe as she states it is always there and does not really get worse with activity.  She went to the [facility] Emergency room earlier this week evidently complaining of chest pain, was treated with an unknown blue medication that she states gave her diarrhea and she quit taking it.  She did not feel well yesterday and stayed in the bed most of the day yesterday.  She went to church this morning and had the onset of numbness all over and felt nausea.  She got up to go to altar and while kneeling at the altar had a syncopal episode.  It is unclear whether she actually had true loss of conscious or just became severely weak.  She felt as though she could not move anything and was initially presented to the emergency room at [facility] as a code stroke.  She has been evaluated by the neurologist and is not felt to have an acute stroke. Initial troponin was negative at [facility].  She was brought to [facility] CDU and has been admitted to the Medicine Service.  Two additional point- of-care troponins were drawn which were mildly elevated.  The patient complains of a dull left-sided chest pain that is constant she  says has been present for 3 weeks.  She says that she just does not feel well. She says she has had a 20-pound weight loss over the past 3 weeks.  PAST MEDICAL HISTORY:  Remarkable for hypertension, diabetes, hyperlipidemia.  She has a history of some unknown kidney problems.  PAST SURGICAL HISTORY:  Cholecystectomy, unknown kidney surgery, and left carotid endarterectomy.  ALLERGIES:  None known.  CURRENT MEDICATIONS:  She is sketchy on them and states she is on metformin and Lipitor.  I could not really remember the names of her other medication at this time.  FAMILY HISTORY:  Father died of cancer.  Mother died of emphysema.  A brother and 2 sisters are living.  One sister died of lung cancer.  SOCIAL HISTORY:  He has been married for over 40 years, lives with her husband in [place] and she smoked remotely many years ago but has not smoked for many years.  No alcohol usage.  REVIEW OF SYSTEMS:  She says she has had a 20-pound weight loss over the past couple of weeks.  She has a history  of some abdominal nausea as well as some constipation.  She has some mild dyspnea with exertion. She has no midsternal chest discomfort.  She has no palpitations and no known history of syncope previously.  She has very minimal edema occasionally. She has had a previous stroke and TIA in the past.  Other than as noted above, the remainder of review of systems is unremarkable.  PHYSICAL EXAMINATION:  GENERAL:  She is an elderly woman appearing depressed. VITAL SIGNS:  Blood pressure is currently 110/70, pulse is currently 90 and regular. SKIN:  Warm and dry. HEENT:  EOMI.  PERRLA.  Fundi not examined.  Pharynx negative. CNS:  Clear. NECK:  Supple without masses.  There is a healed left carotid endarterectomy scar noted.  No thyromegaly. LUNGS:  Clear bilaterally. CARDIOVASCULAR:  Normal S1 and S2.  No S3, S4, or murmur. ABDOMEN:  Soft, nontender.  No hepatosplenomegaly, mass, or  aneurysm. EXTREMITIES:  Femoral pulses are present.  Distal pulses are present and are 2+.  There are mild venous varicosities noted on the right leg and none on the left leg.  No edema is noted.  EKG shows a left bundle-branch block.  Her lab data shows a sodium of 132, potassium of 3.8.  INR is normal.  White count is elevated 12,300. Her alkaline phosphatase is mildly elevated.  Initial troponin and CPK- MB were negative.  Two point-of-care troponins were minimally elevated.  IMPRESSION: 1. Abnormal point-of-care troponin but negative serum troponin. 2. Atypical left chest pain in a patient who has diabetes and multiple     risk factors. 3. 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 4. Hyperlipidemia under treatment. 5. Non-insulin dependent diabetes. 6. Hypertension, currently controlled. 7. Left bundle-branch block. 8. Possible syncopal episode versus episode of severe weakness.  RECOMMENDATIONS:  History is nebulous and is unclear.  She should have serial cardiac enzymes and because of her left bundle-branch block and known vascular disease, we would suggest screening with Lexiscan Cardiolite study.  She should be observed on telemetry for evidence of bradycardia or arrhythmia.  Check echocardiogram.  [facility] will see in the morning.     Georga Hacking, M.D.     WST/MEDQ  D:  06/06/2011  T:  06/07/2011  Job:  [id number]  cc:   Mickey Farber, MD V. Charlena Cross, MD  Electronically Signed by Lacretia Nicks. Donnie Aho M.D. on 06/23/2011 12:39:26 PM

## 2011-06-25 ENCOUNTER — Encounter: Payer: Self-pay | Admitting: Surgery

## 2011-06-29 NOTE — Discharge Summary (Signed)
[name], LACEY NO.:  [id number]  MEDICAL RECORD NO.:  [id number]  LOCATION:  2041                         FACILITY:  [facility]  PHYSICIAN:  Jeoffrey Massed, MD    DATE OF BIRTH:  September 13, 1941  DATE OF ADMISSION:  06/06/2011 DATE OF DISCHARGE:  06/08/2011                        DISCHARGE SUMMARY - REFERRING   PRIMARY CARE PRACTITIONER:  Dr. Mickey Farber in [place].  PRIMARY VASCULAR SURGEON:  Larina Earthly, MD  PRIMARY DISCHARGE DIAGNOSES: 1. Likely right brain transient ischemic attack with workup so far     negative. 2. Atypical chest pain with cardiac cath done on September 17 showing     nonobstructive coronary artery disease with a normal left     ventricular systolic function. 3. Known carotid artery disease with a history of left carotid     endarterectomy.  SECONDARY DISCHARGE DIAGNOSES/PAST MEDICAL HISTORY:  Includes the following: 1. Hypertension. 2. Type 2 diabetes. 3. Dyslipidemia. 4. History of left-sided carotid endarterectomy 6 years ago.  DISCHARGE MEDICATIONS:  Include the following: 1. Plavix 75 mg 1 tablet daily. 2. Glucotrol XL 10 mg 1 tablet twice daily. 3. Lipitor 80 mg 1 tablet daily at bedtime. 4. Metformin 1000 mg 1 tablet twice daily. 5. ProAir inhaler 2 puffs inhaled q.6 h. p.r.n.  BRIEF HISTORY OF PRESENT ILLNESS:  The patient is a very pleasant 70- year-old female with the above-noted medical problems who was brought in for left-sided chest pain and left-sided lower extremity weakness that resolved.  She was seen in consult by both by Neurology and Cardiology. For further details, please see the history and physical that was dictated by Dr. Toniann Fail on admission.  PROCEDURES PERFORMED: 1. Cardiac catheterization done on June 07, 2011, showed     nonobstructive coronary artery disease.  Normal LV systolic     function, normal left ventricular end-diastolic pressure. 2. Carotid Dopplers done on June 07, 2011 showed no significant     extracranial carotid stenosis demonstrating right internal carotid     artery a.  Left internal carotid artery demonstrates a 40-59% ICA     stenosis.  CONSULTANTS ON THE CASE: 1. Dr. Thad Ranger and Dr. Pearlean Brownie from Neurology and the Stroke Team     respectively. 2. [facility].  PERTINENT LABORATORY DATA: 1. HBA1c is 6.7. 2. LDL cholesterol was 57.  BRIEF HOSPITAL COURSE: 1. Chest pain.  The patient did have evidence of left bundle-branch on     the EKG and one of her cardiac enzymes were slightly elevated.     Because of this recurrent issue of this chest pain, [facility] was consulted and the patient was taken for cardiac     catheterization noted as above.  Please note that this is the     second episode of chest pain in a couple of weeks.  Previously,     this patient was admitted to [facility] where a nuclear     stress test was done which showed no evidence of ischemia.  The     cardiac cath done here only shows mild nonobstructive coronary     artery disease and medical  management is recommended. 2. Possible right brain TIA.  Apparently, this is a second episode of     left-sided weakness in the past 2 weeks.  Please also note that     this weakness associated with chest pain on both occasions.  In any     event, the patient did undergo a workup which included a MRI and     MRA of the brain which did not show any acute stroke.  The MRA of     the brain did not show any significant stenosis.  Her carotid     Doppler as noted as above.  The patient's ejection fraction on the     cardiac cath is noted as above.  Neurology has seen the patient and     suggested changing the aspirin to Plavix.  She will continue her     Lipitor and continue on her metformin and her Glucotrol for     continued and diabetic control.  She does have history of having     left carotid endarterectomy and her carotid Doppler showed 40-59%      stenosis on the left side.  I have asked her to follow up with her     primary vascular surgeon as an outpatient in the next few weeks for     further recommendations and continued followup of this issue as she     claims understanding. 3. History of questionable history of hypertension.  However, the     patient's blood pressure is well controlled, off medications.  REVIEW OF OUTPATIENT MEDICATION:  I does not feel that the patient takes any medications as well.  She will require continued monitoring of her blood pressure issues as well.  DISPOSITION:  It is felt that this patient is stable to be discharged home.  FOLLOWUP INSTRUCTIONS: 1. The patient to follow up with the primary care practitioner, Dr.     Blane Ohara in the next 1 week. 2. The patient to follow up with Dr. Tawanna Cooler Early from [facility]     in the next 1 or 2 weeks.  She is to call     and make an appointment. 3. The patient to follow up with Dr. Pearlean Brownie of [facility] in 2     months.  Total time spent for discharge 45 minutes.     Jeoffrey Massed, MD     SG/MEDQ  D:  06/08/2011  T:  06/08/2011  Job:  [id number]  cc:   Mickey Farber, MD Larina Earthly, M.D. Pramod P. Pearlean Brownie, MD  Electronically Signed by Jeoffrey Massed  on 06/29/2011 03:39:24 PM

## 2011-07-05 ENCOUNTER — Ambulatory Visit (INDEPENDENT_AMBULATORY_CARE_PROVIDER_SITE_OTHER): Payer: Medicare Other | Admitting: Surgery

## 2011-07-05 ENCOUNTER — Encounter: Payer: Self-pay | Admitting: Surgery

## 2011-07-05 VITALS — BP 150/79 | HR 90 | Resp 14 | Ht 61.0 in | Wt 131.0 lb

## 2011-07-05 DIAGNOSIS — I83893 Varicose veins of bilateral lower extremities with other complications: Secondary | ICD-10-CM

## 2011-07-05 DIAGNOSIS — I6529 Occlusion and stenosis of unspecified carotid artery: Secondary | ICD-10-CM

## 2011-07-05 NOTE — Progress Notes (Signed)
[facility] [place]   Patient name: [name] MRN: [id number] DOB: 06/24/1941 Sex: female     Chief Complaint  Patient presents with  . Carotid    pt was admitted on 06-06-11 to 06-08-11 for Stroke symptoms. She had a Left CEA in 2009 by Dr. Myra Gianotti.    HISTORY OF PRESENT ILLNESS: This is a 70 year old female who comes in today for followup on 06/06/2011 she presented to the hospital with weakness in her left lower extremity and since then has complained of weakness in her left upper extremity. A code stroke was called her symptoms last approximately one hour a CT scan was negative MRI showed nothing acute. Her point of care troponin was positive with a left bundle branch block subsequent cardiac catheterization showed mild nonobstructive coronary artery disease and medical management was recommended. She had carotid ultrasound which showed no significant right carotid stenosis and 40-59% left carotid stenosis. The patient has a history of left carotid endarterectomy in 2009 for symptomatic stenosis.  The patient also complains of heaviness in her left leg. She had similar complaints in her right leg and underwent endovenous laser ablation in March of 2012 which alleviated her symptoms. At that time a reflux evaluation her left leg was performed which showed reflux at the saphenofemoral junction otherwise there was no evidence of reflux.  Today the patient states that she still has some weakness in her left arm and left leg.  Past Medical History  Diagnosis Date  . Hyperlipidemia   . Stroke   . GERD (gastroesophageal reflux disease)   . Arthritis   . Asthma   . Leg pain   . Anxiety   . Anemia   . Carotid artery occlusion   . Diabetes mellitus 59    Past Surgical History  Procedure Date  . Carotid endarterectomy 07/26/2008    left  . Cholecystectomy     History   Social History  . Marital Status: Married    Spouse Name: N/A    Number of Children: N/A    . Years of Education: N/A   Occupational History  . Not on file.   Social History Main Topics  . Smoking status: Former Smoker    Types: Cigarettes    Quit date: 09/20/1996  . Smokeless tobacco: Not on file  . Alcohol Use: No  . Drug Use:   . Sexually Active:    Other Topics Concern  . Not on file   Social History Narrative  . No narrative on file    Family History  Problem Relation Age of Onset  . Stroke Other   . Heart disease Other     Allergies as of 07/05/2011  . (No Known Allergies)    Current Outpatient Prescriptions on File Prior to Visit  Medication Sig Dispense Refill  . atorvastatin (LIPITOR) 80 MG tablet Take 80 mg by mouth daily.        . cyclobenzaprine (FLEXERIL) 10 MG tablet Take 10 mg by mouth 3 (three) times daily as needed.        Marland Kitchen esomeprazole (NEXIUM) 40 MG capsule Take 40 mg by mouth daily before breakfast.        . glipiZIDE (GLUCOTROL) 10 MG tablet Take 10 mg by mouth daily.        . metFORMIN (GLUMETZA) 500 MG (MOD) 24 hr tablet Take 500 mg by mouth daily with breakfast.        . zolpidem (AMBIEN) 10 MG tablet  Take 10 mg by mouth at bedtime as needed.        Marland Kitchen aspirin EC 81 MG tablet Take 81 mg by mouth daily.        . fenofibrate micronized (LOFIBRA) 200 MG capsule Take 200 mg by mouth daily before breakfast.           REVIEW OF SYSTEMS: No changes all of his systems are negative except was mentioned in the history of present illness PHYSICAL EXAMINATION: General: The patient appears their stated age.  Vital signs are BP 150/79  Pulse 90  Resp 14  Ht 5\' 1"  (1.549 m)  Wt 131 lb (59.421 kg)  BMI 24.75 kg/m2  SpO2 97% Pulmonary: There is a good air exchange bilaterally without wheezing or rales. Abdomen: Soft and non-tender with normal pitch bowel sounds. Musculoskeletal: There are no major deformities.  There is no significant extremity pain. Mild left leg edema Neurologic: No focal weakness or paresthesias are detected, equal  strength right and left arm Skin: There are no ulcer or rashes noted. Psychiatric: The patient has normal affect. Cardiovascular: There is a regular rate and rhythm without significant murmur appreciated. No carotid bruits   Diagnostic Studies I have reviewed her ultrasound as an inpatient which shows no significant right carotid stenosis and 40-59% left carotid stenosis he MRA of the head was unremarkable CT scan was also negative for acute abnormality  Assessment: Status post left carotid endarterectomy Plan: In regards to her carotid findings she will need to have followup ultrasound in one year. I do not believe her extracranial carotid disease is responsible for her current symptoms as she has no evidence of right-sided carotid stenosis and her symptoms are primarily on the left side. I agree with the addition to Plavix to her medication profile.  Since she still complains of left leg heaviness I'm going to repeat her venous reflux evaluation on the left leg this cannot be done today and will be done in 2 weeks  V. Charlena Cross, M.D. [facility] [place] Office: [phone]

## 2011-07-06 NOTE — Cardiovascular Report (Signed)
[name], [name] NO.:  [id number]  MEDICAL RECORD NO.:  [id number]  LOCATION:  2041                         FACILITY:  [facility]  PHYSICIAN:  Lorine Bears, MD     DATE OF BIRTH:  07-19-1941  DATE OF PROCEDURE: DATE OF DISCHARGE:                           CARDIAC CATHETERIZATION   PROCEDURES PERFORMED: 1. Left heart catheterization. 2. Coronary angiography. 3. Left ventricular angiography.  INDICATIONS AND CLINICAL HISTORY:  This is a 70 year old female who presented with atypical chest pain.  One set of cardiac enzymes was abnormal with a troponin of 0.37.  She had a nuclear stress test done last week at an outside hospital which showed fixed anteroapical defect. Due to her chest pain and slightly abnormal stress test with borderline cardiac enzymes, cardiac catheterization was recommended.  Risks, benefits and alternatives were discussed with the patient.  ACCESS:  Right femoral artery.  STUDY DETAILS:  A standard informed consent was obtained.  The right groin area was prepped in a sterile fashion.  It was anesthetized with 1% lidocaine.  A 5-French sheath was placed in the right femoral artery after anterior puncture.  Coronary angiography was performed with a JL- 4, JR-4 and a pigtail catheter.  All catheter exchanges were done over the wire.  The patient tolerated the procedure well with no immediate complications.  STUDY FINDINGS:  Hemodynamic findings:  Left ventricular pressure is 110/3, with left ventricular end-diastolic pressure of 3 mmHg.  Aortic pressure is 106/50 with a mean pressure of 72 mmHg.  Left ventricular angiography:  This showed normal LV systolic function with an estimated ejection fraction of 55%.  There was wall motion abnormality in the mid anterior wall but that happened during run of nonsustained ventricular tachycardia and likely does not represent an abnormality in that area.  CORONARY ANGIOGRAPHY: 1. Left main  coronary artery:  The vessel is normal in size and smooth     and free of significant disease. 2. Left anterior descending artery:  The vessel is normal in size and     free of any obstructive disease.  There is a 20% discrete ostial     stenosis.  The first diagonal is absent and mostly supplied by high     OM distribution.  Second diagonal is normal in size and free of     significant disease.  Third diagonal is a tiny branch. 3. Left circumflex artery:  The vessel is normal in size and     nondominant.  It has no evidence of atherosclerosis or any     obstructive disease.  OM-1 has a high takeoff and overall large in     size.  OM-2 and OM-3 are normal in size and all of them are free of     significant disease. 4. Right coronary artery:  The vessel is normal in size and dominant.     It has no evidence of atherosclerosis or obstructive disease.  The     PDA is a large size branch.  The posterolateral branches are     overall medium in size.  STUDY CONCLUSIONS: 1. No evidence of obstructive coronary artery disease.  There  is only     a 20% stenosis in the ostial LAD. 2. Normal LV systolic function. 3. Normal left ventricular end-diastolic pressure. 4. Recommend medical management.     Lorine Bears, MD     MA/MEDQ  D:  06/07/2011  T:  06/07/2011  Job:  [id number]  Electronically Signed by Lorine Bears MD on 07/06/2011 12:21:25 PM

## 2011-07-19 ENCOUNTER — Other Ambulatory Visit (INDEPENDENT_AMBULATORY_CARE_PROVIDER_SITE_OTHER): Payer: Medicare Other | Admitting: *Deleted

## 2011-07-19 ENCOUNTER — Other Ambulatory Visit: Payer: Self-pay

## 2011-07-19 DIAGNOSIS — I83893 Varicose veins of bilateral lower extremities with other complications: Secondary | ICD-10-CM

## 2011-08-02 NOTE — Procedures (Unsigned)
LOWER EXTREMITY VENOUS REFLUX EXAM  INDICATION:  Varicose vein with other complication.  Patient states that her left lower extremity has  been asymptomatic since an isolated left arm and leg weakness from a stroke in September 2012.  EXAM:  Using color-flow imaging and pulse Doppler spectral analysis, the left common femoral, superficial femoral, popliteal, posterior tibial, greater and lesser saphenous veins are evaluated.  There is evidence suggesting deep venous reflux >500 milliseconds noted in the left common femoral and popliteal veins.  The left saphenofemoral junction is competent. The left nontortuous GSV demonstrates reflux of >5103milliseconds.   The left proximal short saphenous vein demonstrates competency.  GSV Diameter (used if found to be incompetent only)                                           Right    Left Proximal Greater Saphenous Vein           cm       0.54 cm Proximal-to-mid-thigh                     cm       cm Mid thigh                                 cm       0.38 cm Mid-distal thigh                          cm       cm Distal thigh                              cm       0.43 cm Knee                                      cm       0.40 cm  IMPRESSION:  Left great saphenous, common femoral, and popliteal vein reflux is noted, as described above.  ___________________________________________ V. Charlena Cross, MD  CH/MEDQ  D:  07/19/2011  T:  07/19/2011  Job:  [id number]

## 2011-10-20 ENCOUNTER — Encounter (INDEPENDENT_AMBULATORY_CARE_PROVIDER_SITE_OTHER): Admitting: Ophthalmology

## 2011-11-17 ENCOUNTER — Encounter (INDEPENDENT_AMBULATORY_CARE_PROVIDER_SITE_OTHER): Payer: Medicare Other | Admitting: Ophthalmology

## 2011-11-17 DIAGNOSIS — E1139 Type 2 diabetes mellitus with other diabetic ophthalmic complication: Secondary | ICD-10-CM | POA: Diagnosis not present

## 2011-11-17 DIAGNOSIS — E1165 Type 2 diabetes mellitus with hyperglycemia: Secondary | ICD-10-CM

## 2011-11-17 DIAGNOSIS — H43819 Vitreous degeneration, unspecified eye: Secondary | ICD-10-CM | POA: Diagnosis not present

## 2011-11-17 DIAGNOSIS — H4010X0 Unspecified open-angle glaucoma, stage unspecified: Secondary | ICD-10-CM

## 2011-11-17 DIAGNOSIS — H2510 Age-related nuclear cataract, unspecified eye: Secondary | ICD-10-CM

## 2011-11-17 DIAGNOSIS — H3530 Unspecified macular degeneration: Secondary | ICD-10-CM | POA: Diagnosis not present

## 2011-11-17 DIAGNOSIS — E11319 Type 2 diabetes mellitus with unspecified diabetic retinopathy without macular edema: Secondary | ICD-10-CM

## 2011-11-24 DIAGNOSIS — J209 Acute bronchitis, unspecified: Secondary | ICD-10-CM | POA: Diagnosis not present

## 2011-12-02 DIAGNOSIS — E7800 Pure hypercholesterolemia, unspecified: Secondary | ICD-10-CM | POA: Diagnosis not present

## 2011-12-02 DIAGNOSIS — J209 Acute bronchitis, unspecified: Secondary | ICD-10-CM | POA: Diagnosis not present

## 2011-12-02 DIAGNOSIS — E119 Type 2 diabetes mellitus without complications: Secondary | ICD-10-CM | POA: Diagnosis not present

## 2011-12-02 DIAGNOSIS — Z79899 Other long term (current) drug therapy: Secondary | ICD-10-CM | POA: Diagnosis not present

## 2011-12-02 DIAGNOSIS — F341 Dysthymic disorder: Secondary | ICD-10-CM | POA: Diagnosis not present

## 2012-02-18 DIAGNOSIS — H2510 Age-related nuclear cataract, unspecified eye: Secondary | ICD-10-CM | POA: Diagnosis not present

## 2012-02-18 DIAGNOSIS — H401290 Low-tension glaucoma, unspecified eye, stage unspecified: Secondary | ICD-10-CM | POA: Diagnosis not present

## 2012-02-28 DIAGNOSIS — J45909 Unspecified asthma, uncomplicated: Secondary | ICD-10-CM | POA: Diagnosis not present

## 2012-02-28 DIAGNOSIS — D649 Anemia, unspecified: Secondary | ICD-10-CM | POA: Diagnosis not present

## 2012-02-28 DIAGNOSIS — K219 Gastro-esophageal reflux disease without esophagitis: Secondary | ICD-10-CM | POA: Diagnosis not present

## 2012-02-28 DIAGNOSIS — M545 Low back pain: Secondary | ICD-10-CM | POA: Diagnosis not present

## 2012-02-28 DIAGNOSIS — F341 Dysthymic disorder: Secondary | ICD-10-CM | POA: Diagnosis not present

## 2012-02-28 DIAGNOSIS — E119 Type 2 diabetes mellitus without complications: Secondary | ICD-10-CM | POA: Diagnosis not present

## 2012-02-28 DIAGNOSIS — E7800 Pure hypercholesterolemia, unspecified: Secondary | ICD-10-CM | POA: Diagnosis not present

## 2012-02-28 DIAGNOSIS — M2550 Pain in unspecified joint: Secondary | ICD-10-CM | POA: Diagnosis not present

## 2012-03-02 DIAGNOSIS — M5137 Other intervertebral disc degeneration, lumbosacral region: Secondary | ICD-10-CM | POA: Diagnosis not present

## 2012-03-02 DIAGNOSIS — M79609 Pain in unspecified limb: Secondary | ICD-10-CM | POA: Diagnosis not present

## 2012-03-02 DIAGNOSIS — R937 Abnormal findings on diagnostic imaging of other parts of musculoskeletal system: Secondary | ICD-10-CM | POA: Diagnosis not present

## 2012-05-31 DIAGNOSIS — M2550 Pain in unspecified joint: Secondary | ICD-10-CM | POA: Diagnosis not present

## 2012-05-31 DIAGNOSIS — Z79899 Other long term (current) drug therapy: Secondary | ICD-10-CM | POA: Diagnosis not present

## 2012-05-31 DIAGNOSIS — J069 Acute upper respiratory infection, unspecified: Secondary | ICD-10-CM | POA: Diagnosis not present

## 2012-05-31 DIAGNOSIS — E7800 Pure hypercholesterolemia, unspecified: Secondary | ICD-10-CM | POA: Diagnosis not present

## 2012-05-31 DIAGNOSIS — M545 Low back pain: Secondary | ICD-10-CM | POA: Diagnosis not present

## 2012-05-31 DIAGNOSIS — E1169 Type 2 diabetes mellitus with other specified complication: Secondary | ICD-10-CM | POA: Diagnosis not present

## 2012-05-31 DIAGNOSIS — F341 Dysthymic disorder: Secondary | ICD-10-CM | POA: Diagnosis not present

## 2012-05-31 DIAGNOSIS — E119 Type 2 diabetes mellitus without complications: Secondary | ICD-10-CM | POA: Diagnosis not present

## 2012-06-21 DIAGNOSIS — Z23 Encounter for immunization: Secondary | ICD-10-CM | POA: Diagnosis not present

## 2012-06-21 DIAGNOSIS — F341 Dysthymic disorder: Secondary | ICD-10-CM | POA: Diagnosis not present

## 2012-07-06 DIAGNOSIS — H401290 Low-tension glaucoma, unspecified eye, stage unspecified: Secondary | ICD-10-CM | POA: Diagnosis not present

## 2012-07-06 DIAGNOSIS — H2510 Age-related nuclear cataract, unspecified eye: Secondary | ICD-10-CM | POA: Diagnosis not present

## 2012-07-10 ENCOUNTER — Other Ambulatory Visit: Payer: Self-pay

## 2012-07-14 ENCOUNTER — Encounter: Payer: Self-pay | Admitting: Neurosurgery

## 2012-07-17 ENCOUNTER — Ambulatory Visit: Payer: Medicare Other | Admitting: Neurosurgery

## 2012-07-17 ENCOUNTER — Other Ambulatory Visit: Payer: Self-pay

## 2012-07-18 DIAGNOSIS — Z01818 Encounter for other preprocedural examination: Secondary | ICD-10-CM | POA: Diagnosis not present

## 2012-07-18 DIAGNOSIS — H268 Other specified cataract: Secondary | ICD-10-CM | POA: Diagnosis not present

## 2012-07-19 ENCOUNTER — Encounter: Payer: Self-pay | Admitting: Neurosurgery

## 2012-07-19 ENCOUNTER — Encounter: Payer: Self-pay | Admitting: Vascular Surgery

## 2012-07-20 ENCOUNTER — Ambulatory Visit (INDEPENDENT_AMBULATORY_CARE_PROVIDER_SITE_OTHER): Payer: Medicare Other | Admitting: Neurosurgery

## 2012-07-20 ENCOUNTER — Encounter: Payer: Self-pay | Admitting: Neurosurgery

## 2012-07-20 ENCOUNTER — Other Ambulatory Visit (INDEPENDENT_AMBULATORY_CARE_PROVIDER_SITE_OTHER): Payer: Medicare Other | Admitting: *Deleted

## 2012-07-20 VITALS — BP 161/68 | HR 73 | Resp 16 | Ht 61.0 in | Wt 128.3 lb

## 2012-07-20 DIAGNOSIS — I6529 Occlusion and stenosis of unspecified carotid artery: Secondary | ICD-10-CM | POA: Insufficient documentation

## 2012-07-20 DIAGNOSIS — Z48812 Encounter for surgical aftercare following surgery on the circulatory system: Secondary | ICD-10-CM

## 2012-07-20 NOTE — Addendum Note (Signed)
Addended by: Sharee Pimple on: 07/20/2012 03:04 PM   Modules accepted: Orders

## 2012-07-20 NOTE — Progress Notes (Signed)
[facility] [place] [facility] Carotid Office Note  CC: Carotid surveillance Referring Physician: Brabham  History of Present Illness: 71 year old female patient of Dr. Myra Gianotti status post left CEA in 2009. The patient denies any signs or symptoms of CVA, TIA, amaurosis fugax or any neural deficit. The patient denies any new medical diagnoses or recent surgeries however she is to have some type of topical surgery next week.  Past Medical History  Diagnosis Date  . Hyperlipidemia   . Stroke   . GERD (gastroesophageal reflux disease)   . Arthritis   . Asthma   . Leg pain   . Anxiety   . Anemia   . Carotid artery occlusion   . Diabetes mellitus 59    ROS: [x]  Positive   [ ]  Denies    General: [ ]  Weight loss, [ ]  Fever, [ ]  chills Neurologic: [ ]  Dizziness, [ ]  Blackouts, [ ]  Seizure [ ]  Stroke, [ ]  "Mini stroke", [ ]  Slurred speech, [ ]  Temporary blindness; [ ]  weakness in arms or legs, [ ]  Hoarseness Cardiac: [ ]  Chest pain/pressure, [ ]  Shortness of breath at rest [ ]  Shortness of breath with exertion, [ ]  Atrial fibrillation or irregular heartbeat Vascular: [ ]  Pain in legs with walking, [ ]  Pain in legs at rest, [ ]  Pain in legs at night,  [ ]  Non-healing ulcer, [ ]  Blood clot in vein/DVT,   Pulmonary: [ ]  Home oxygen, [ ]  Productive cough, [ ]  Coughing up blood, [ ]  Asthma,  [ ]  Wheezing Musculoskeletal:  [ ]  Arthritis, [ ]  Low back pain, [ ]  Joint pain Hematologic: [ ]  Easy Bruising, [ ]  Anemia; [ ]  Hepatitis Gastrointestinal: [ ]  Blood in stool, [ ]  Gastroesophageal Reflux/heartburn, [ ]  Trouble swallowing Urinary: [ ]  chronic Kidney disease, [ ]  on HD - [ ]  MWF or [ ]  TTHS, [ ]  Burning with urination, [ ]  Difficulty urinating Skin: [ ]  Rashes, [ ]  Wounds Psychological: [ ]  Anxiety, [ ]  Depression   Social History History  Substance Use Topics  . Smoking status: Former Smoker    Types: Cigarettes    Quit date: 09/20/1996  . Smokeless tobacco: Not on file    . Alcohol Use: No    Family History Family History  Problem Relation Age of Onset  . Stroke Other   . Heart disease Other     No Known Allergies  Current Outpatient Prescriptions  Medication Sig Dispense Refill  . albuterol (PROVENTIL HFA;VENTOLIN HFA) 108 (90 BASE) MCG/ACT inhaler Inhale 2 puffs into the lungs every 6 (six) hours as needed.        Marland Kitchen aspirin EC 81 MG tablet Take 81 mg by mouth daily.        Marland Kitchen atorvastatin (LIPITOR) 80 MG tablet Take 80 mg by mouth daily.        . clopidogrel (PLAVIX) 75 MG tablet Take 75 mg by mouth daily.        . cyclobenzaprine (FLEXERIL) 10 MG tablet Take 10 mg by mouth 3 (three) times daily as needed.        Marland Kitchen esomeprazole (NEXIUM) 40 MG capsule Take 40 mg by mouth daily before breakfast.        . fenofibrate micronized (LOFIBRA) 200 MG capsule Take 200 mg by mouth daily before breakfast.        . glipiZIDE (GLUCOTROL) 10 MG tablet Take 10 mg by mouth daily.        Marland Kitchen  metFORMIN (GLUMETZA) 500 MG (MOD) 24 hr tablet Take 500 mg by mouth daily with breakfast.        . zolpidem (AMBIEN) 10 MG tablet Take 10 mg by mouth at bedtime as needed.          Physical Examination  Filed Vitals:   07/20/12 1359  BP: 161/68  Pulse: 73  Resp:     Body mass index is 24.24 kg/(m^2).  General:  WDWN in NAD Gait: Normal HEENT: WNL Eyes: Pupils equal Pulmonary: normal non-labored breathing , without Rales, rhonchi,  wheezing Cardiac: RRR, without  Murmurs, rubs or gallops; Abdomen: soft, NT, no masses Skin: no rashes, ulcers noted  Vascular Exam Pulses: 3+ radial pulses bilaterally Carotid bruits: Carotid pulses to auscultation no bruits are heard Extremities without ischemic changes, no Gangrene , no cellulitis; no open wounds;  Musculoskeletal: no muscle wasting or atrophy   Neurologic: A&O X 3; Appropriate Affect ; SENSATION: normal; MOTOR FUNCTION:  moving all extremities equally. Speech is fluent/normal  Non-Invasive Vascular  Imaging CAROTID DUPLEX 07/20/2012  Right ICA 0 - 19% stenosis Left ICA 0 - 19% stenosis   ASSESSMENT/PLAN: Asymptomatic patient with very minimal bilateral carotid stenosis. The patient will followup in one year with repeat carotid duplex. The patient's questions were encouraged and answered, she is in agreement with this plan.  Lauree Chandler ANP   Clinic MD: Darrick Penna

## 2012-07-28 ENCOUNTER — Other Ambulatory Visit: Payer: Self-pay

## 2012-07-28 ENCOUNTER — Ambulatory Visit: Payer: Medicare Other | Admitting: Neurosurgery

## 2012-07-28 DIAGNOSIS — H2510 Age-related nuclear cataract, unspecified eye: Secondary | ICD-10-CM | POA: Diagnosis not present

## 2012-07-28 DIAGNOSIS — H269 Unspecified cataract: Secondary | ICD-10-CM | POA: Diagnosis not present

## 2012-08-21 DIAGNOSIS — H2510 Age-related nuclear cataract, unspecified eye: Secondary | ICD-10-CM | POA: Diagnosis not present

## 2012-08-25 DIAGNOSIS — H2510 Age-related nuclear cataract, unspecified eye: Secondary | ICD-10-CM | POA: Diagnosis not present

## 2012-08-25 DIAGNOSIS — H269 Unspecified cataract: Secondary | ICD-10-CM | POA: Diagnosis not present

## 2012-08-28 ENCOUNTER — Other Ambulatory Visit: Payer: Self-pay

## 2012-08-28 ENCOUNTER — Ambulatory Visit: Payer: Medicare Other | Admitting: Neurosurgery

## 2012-09-05 DIAGNOSIS — F341 Dysthymic disorder: Secondary | ICD-10-CM | POA: Diagnosis not present

## 2012-09-05 DIAGNOSIS — J0100 Acute maxillary sinusitis, unspecified: Secondary | ICD-10-CM | POA: Diagnosis not present

## 2012-09-05 DIAGNOSIS — E119 Type 2 diabetes mellitus without complications: Secondary | ICD-10-CM | POA: Diagnosis not present

## 2012-09-05 DIAGNOSIS — E1149 Type 2 diabetes mellitus with other diabetic neurological complication: Secondary | ICD-10-CM | POA: Diagnosis not present

## 2012-09-05 DIAGNOSIS — M2550 Pain in unspecified joint: Secondary | ICD-10-CM | POA: Diagnosis not present

## 2012-09-05 DIAGNOSIS — L659 Nonscarring hair loss, unspecified: Secondary | ICD-10-CM | POA: Diagnosis not present

## 2012-09-05 DIAGNOSIS — L658 Other specified nonscarring hair loss: Secondary | ICD-10-CM | POA: Diagnosis not present

## 2012-09-05 DIAGNOSIS — Z79899 Other long term (current) drug therapy: Secondary | ICD-10-CM | POA: Diagnosis not present

## 2012-09-05 DIAGNOSIS — M545 Low back pain: Secondary | ICD-10-CM | POA: Diagnosis not present

## 2012-09-05 DIAGNOSIS — E7800 Pure hypercholesterolemia, unspecified: Secondary | ICD-10-CM | POA: Diagnosis not present

## 2012-09-21 DIAGNOSIS — H401290 Low-tension glaucoma, unspecified eye, stage unspecified: Secondary | ICD-10-CM | POA: Diagnosis not present

## 2012-11-17 ENCOUNTER — Ambulatory Visit (INDEPENDENT_AMBULATORY_CARE_PROVIDER_SITE_OTHER): Payer: Medicare Other | Admitting: Ophthalmology

## 2012-12-07 DIAGNOSIS — Z79899 Other long term (current) drug therapy: Secondary | ICD-10-CM | POA: Diagnosis not present

## 2012-12-07 DIAGNOSIS — E119 Type 2 diabetes mellitus without complications: Secondary | ICD-10-CM | POA: Diagnosis not present

## 2012-12-07 DIAGNOSIS — E1149 Type 2 diabetes mellitus with other diabetic neurological complication: Secondary | ICD-10-CM | POA: Diagnosis not present

## 2012-12-07 DIAGNOSIS — R29898 Other symptoms and signs involving the musculoskeletal system: Secondary | ICD-10-CM | POA: Diagnosis not present

## 2012-12-07 DIAGNOSIS — F341 Dysthymic disorder: Secondary | ICD-10-CM | POA: Diagnosis not present

## 2012-12-07 DIAGNOSIS — R51 Headache: Secondary | ICD-10-CM | POA: Diagnosis not present

## 2012-12-07 DIAGNOSIS — W19XXXA Unspecified fall, initial encounter: Secondary | ICD-10-CM | POA: Diagnosis not present

## 2012-12-07 DIAGNOSIS — M2550 Pain in unspecified joint: Secondary | ICD-10-CM | POA: Diagnosis not present

## 2012-12-07 DIAGNOSIS — E7800 Pure hypercholesterolemia, unspecified: Secondary | ICD-10-CM | POA: Diagnosis not present

## 2012-12-07 DIAGNOSIS — I69959 Hemiplegia and hemiparesis following unspecified cerebrovascular disease affecting unspecified side: Secondary | ICD-10-CM | POA: Diagnosis not present

## 2012-12-14 DIAGNOSIS — M6281 Muscle weakness (generalized): Secondary | ICD-10-CM | POA: Diagnosis not present

## 2012-12-14 DIAGNOSIS — R29898 Other symptoms and signs involving the musculoskeletal system: Secondary | ICD-10-CM | POA: Diagnosis not present

## 2012-12-14 DIAGNOSIS — R51 Headache: Secondary | ICD-10-CM | POA: Diagnosis not present

## 2012-12-14 DIAGNOSIS — I079 Rheumatic tricuspid valve disease, unspecified: Secondary | ICD-10-CM | POA: Diagnosis not present

## 2012-12-14 DIAGNOSIS — I369 Nonrheumatic tricuspid valve disorder, unspecified: Secondary | ICD-10-CM | POA: Diagnosis not present

## 2012-12-14 DIAGNOSIS — I69959 Hemiplegia and hemiparesis following unspecified cerebrovascular disease affecting unspecified side: Secondary | ICD-10-CM | POA: Diagnosis not present

## 2012-12-14 DIAGNOSIS — I059 Rheumatic mitral valve disease, unspecified: Secondary | ICD-10-CM | POA: Diagnosis not present

## 2012-12-26 DIAGNOSIS — M6281 Muscle weakness (generalized): Secondary | ICD-10-CM | POA: Diagnosis not present

## 2013-01-03 DIAGNOSIS — M6281 Muscle weakness (generalized): Secondary | ICD-10-CM | POA: Diagnosis not present

## 2013-01-10 DIAGNOSIS — M6281 Muscle weakness (generalized): Secondary | ICD-10-CM | POA: Diagnosis not present

## 2013-01-17 DIAGNOSIS — M6281 Muscle weakness (generalized): Secondary | ICD-10-CM | POA: Diagnosis not present

## 2013-01-24 DIAGNOSIS — M6281 Muscle weakness (generalized): Secondary | ICD-10-CM | POA: Diagnosis not present

## 2013-01-31 DIAGNOSIS — M6281 Muscle weakness (generalized): Secondary | ICD-10-CM | POA: Diagnosis not present

## 2013-02-08 DIAGNOSIS — M6281 Muscle weakness (generalized): Secondary | ICD-10-CM | POA: Diagnosis not present

## 2013-02-21 DIAGNOSIS — M6281 Muscle weakness (generalized): Secondary | ICD-10-CM | POA: Diagnosis not present

## 2013-02-28 DIAGNOSIS — M6281 Muscle weakness (generalized): Secondary | ICD-10-CM | POA: Diagnosis not present

## 2013-03-07 DIAGNOSIS — M6281 Muscle weakness (generalized): Secondary | ICD-10-CM | POA: Diagnosis not present

## 2013-03-07 DIAGNOSIS — R269 Unspecified abnormalities of gait and mobility: Secondary | ICD-10-CM | POA: Diagnosis not present

## 2013-03-28 DIAGNOSIS — F341 Dysthymic disorder: Secondary | ICD-10-CM | POA: Diagnosis not present

## 2013-03-28 DIAGNOSIS — E7800 Pure hypercholesterolemia, unspecified: Secondary | ICD-10-CM | POA: Diagnosis not present

## 2013-03-28 DIAGNOSIS — E1149 Type 2 diabetes mellitus with other diabetic neurological complication: Secondary | ICD-10-CM | POA: Diagnosis not present

## 2013-03-28 DIAGNOSIS — J45909 Unspecified asthma, uncomplicated: Secondary | ICD-10-CM | POA: Diagnosis not present

## 2013-03-28 DIAGNOSIS — M2550 Pain in unspecified joint: Secondary | ICD-10-CM | POA: Diagnosis not present

## 2013-03-28 DIAGNOSIS — R072 Precordial pain: Secondary | ICD-10-CM | POA: Diagnosis not present

## 2013-05-29 DIAGNOSIS — H1045 Other chronic allergic conjunctivitis: Secondary | ICD-10-CM | POA: Diagnosis not present

## 2013-05-29 DIAGNOSIS — H401290 Low-tension glaucoma, unspecified eye, stage unspecified: Secondary | ICD-10-CM | POA: Diagnosis not present

## 2013-05-29 DIAGNOSIS — E109 Type 1 diabetes mellitus without complications: Secondary | ICD-10-CM | POA: Diagnosis not present

## 2013-06-15 DIAGNOSIS — A088 Other specified intestinal infections: Secondary | ICD-10-CM | POA: Diagnosis not present

## 2013-06-15 DIAGNOSIS — R197 Diarrhea, unspecified: Secondary | ICD-10-CM | POA: Diagnosis not present

## 2013-07-04 DIAGNOSIS — E7800 Pure hypercholesterolemia, unspecified: Secondary | ICD-10-CM | POA: Diagnosis not present

## 2013-07-04 DIAGNOSIS — M2550 Pain in unspecified joint: Secondary | ICD-10-CM | POA: Diagnosis not present

## 2013-07-04 DIAGNOSIS — Z23 Encounter for immunization: Secondary | ICD-10-CM | POA: Diagnosis not present

## 2013-07-04 DIAGNOSIS — S4980XA Other specified injuries of shoulder and upper arm, unspecified arm, initial encounter: Secondary | ICD-10-CM | POA: Diagnosis not present

## 2013-07-04 DIAGNOSIS — F341 Dysthymic disorder: Secondary | ICD-10-CM | POA: Diagnosis not present

## 2013-07-04 DIAGNOSIS — E1149 Type 2 diabetes mellitus with other diabetic neurological complication: Secondary | ICD-10-CM | POA: Diagnosis not present

## 2013-07-04 DIAGNOSIS — J45909 Unspecified asthma, uncomplicated: Secondary | ICD-10-CM | POA: Diagnosis not present

## 2013-07-04 DIAGNOSIS — E119 Type 2 diabetes mellitus without complications: Secondary | ICD-10-CM | POA: Diagnosis not present

## 2013-07-04 DIAGNOSIS — M25519 Pain in unspecified shoulder: Secondary | ICD-10-CM | POA: Diagnosis not present

## 2013-07-04 DIAGNOSIS — I10 Essential (primary) hypertension: Secondary | ICD-10-CM | POA: Diagnosis not present

## 2013-07-05 ENCOUNTER — Other Ambulatory Visit: Payer: Self-pay | Admitting: Family

## 2013-07-05 DIAGNOSIS — Z48812 Encounter for surgical aftercare following surgery on the circulatory system: Secondary | ICD-10-CM

## 2013-07-20 ENCOUNTER — Encounter: Payer: Self-pay | Admitting: Family

## 2013-07-23 ENCOUNTER — Ambulatory Visit (INDEPENDENT_AMBULATORY_CARE_PROVIDER_SITE_OTHER): Payer: Medicare Other | Admitting: Family

## 2013-07-23 ENCOUNTER — Ambulatory Visit (HOSPITAL_COMMUNITY)
Admission: RE | Admit: 2013-07-23 | Discharge: 2013-07-23 | Disposition: A | Discharge status: Home / Self Care | Payer: Medicare Other | Source: Ambulatory Visit | Attending: Family | Admitting: Family

## 2013-07-23 ENCOUNTER — Encounter: Payer: Self-pay | Admitting: Family

## 2013-07-23 DIAGNOSIS — Z48812 Encounter for surgical aftercare following surgery on the circulatory system: Secondary | ICD-10-CM

## 2013-07-23 DIAGNOSIS — I6529 Occlusion and stenosis of unspecified carotid artery: Secondary | ICD-10-CM | POA: Diagnosis not present

## 2013-07-23 NOTE — Patient Instructions (Signed)
Stroke Prevention Some medical conditions and behaviors are associated with an increased chance of having a stroke. You may prevent a stroke by making healthy choices and managing medical conditions. Reduce your risk of having a stroke by:  Staying physically active. Get at least 30 minutes of activity on most or all days.  Not smoking. It may also be helpful to avoid exposure to secondhand smoke.  Limiting alcohol use. Moderate alcohol use is considered to be:  No more than 2 drinks per day for men.  No more than 1 drink per day for nonpregnant women.  Eating healthy foods.  Include 5 or more servings of fruits and vegetables a day.  Certain diets may be prescribed to address high blood pressure, high cholesterol, diabetes, or obesity.  Managing your cholesterol levels.  A low-saturated fat, low-trans fat, low-cholesterol, and high-fiber diet may control cholesterol levels.  Take any prescribed medicines to control cholesterol as directed by your caregiver.  Managing your diabetes.  A controlled-carbohydrate, controlled-sugar diet is recommended to manage diabetes.  Take any prescribed medicines to control diabetes as directed by your caregiver.  Controlling your high blood pressure (hypertension).  A low-salt (sodium), low-saturated fat, low-trans fat, and low-cholesterol diet is recommended to manage high blood pressure.  Take any prescribed medicines to control hypertension as directed by your caregiver.  Maintaining a healthy weight.  A reduced-calorie, low-sodium, low-saturated fat, low-trans fat, low-cholesterol diet is recommended to manage weight.  Stopping drug abuse.  Avoiding birth control pills.  Talk to your caregiver about the risks of taking birth control pills if you are over 35 years old, smoke, get migraines, or have ever had a blood clot.  Getting evaluated for sleep disorders (sleep apnea).  Talk to your caregiver about getting a sleep evaluation  if you snore a lot or have excessive sleepiness.  Taking medicines as directed by your caregiver.  For some people, aspirin or blood thinners (anticoagulants) are helpful in reducing the risk of forming abnormal blood clots that can lead to stroke. If you have the irregular heart rhythm of atrial fibrillation, you should be on a blood thinner unless there is a good reason you cannot take them.  Understand all your medicine instructions. SEEK IMMEDIATE MEDICAL CARE IF:   You have sudden weakness or numbness of the face, arm, or leg, especially on one side of the body.  You have sudden confusion.  You have trouble speaking (aphasia) or understanding.  You have sudden trouble seeing in one or both eyes.  You have sudden trouble walking.  You have dizziness.  You have a loss of balance or coordination.  You have a sudden, severe headache with no known cause.  You have new chest pain or an irregular heartbeat. Any of these symptoms may represent a serious problem that is an emergency. Do not wait to see if the symptoms will go away. Get medical help right away. Call your local emergency services (911 in [place] Do not drive yourself to the hospital. Document Released: 10/14/2004 Document Revised: 11/29/2011 Document Reviewed: 04/26/2011 [employer] Patient Information 2014 [employer].  

## 2013-07-23 NOTE — Progress Notes (Signed)
Established Carotid Patient   History of Present Illness  [name] is a 72 y.o. female patient of Dr. Myra Gianotti status post left CEA in 2009. 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 All 3 strokes manifested as right side weakness. 6 months ago she was evaluated at [facility] for syncopal episode, was told she had a severe CVA, had right hemiplegia, reports aphasia at that time. Has residual right arm and leg weakness and has fallen. Has trouble with her asthma. Patient denies heart problems, denies claudication symptoms. Still has neck and right arm pain that she attributes to c-spine issues, is also wearing lumbar spine brace. Always feels hot, reports weight loss but is gaining some weight recently.  The patient denies amaurosis fugax or monocular blindness.  The patient  reports facial drooping only after c-spine surgery which has since resolved. The patient reports mild continued expressive aphasia.     Patient reports New Medical or Surgical History: both eyes cataract extraction with IOL's  Pt Diabetic: Yes, states in poor control Pt smoker: non-smoker  Pt meds include: Statin : Yes ASA: No: is not taking, did not know that she should be taking Other anticoagulants/antiplatelets: Plavix   Past Medical History  Diagnosis Date  . Hyperlipidemia   . Stroke   . GERD (gastroesophageal reflux disease)   . Arthritis   . Asthma   . Leg pain   . Anxiety   . Anemia   . Carotid artery occlusion   . Diabetes mellitus 59    Social History History  Substance Use Topics  . Smoking status: Former Smoker    Types: Cigarettes    Quit date: 09/20/1996  . Smokeless tobacco: Not on file  . Alcohol Use: No    Family History Family History  Problem Relation Age of Onset  . Stroke Other   . Heart disease Other     Surgical History Past Surgical History  Procedure Laterality Date  . Carotid endarterectomy  07/26/2008    left  . Cholecystectomy      No  Known Allergies  Current Outpatient Prescriptions  Medication Sig Dispense Refill  . albuterol (PROVENTIL HFA;VENTOLIN HFA) 108 (90 BASE) MCG/ACT inhaler Inhale 2 puffs into the lungs every 6 (six) hours as needed.        Marland Kitchen aspirin EC 81 MG tablet Take 81 mg by mouth daily.        Marland Kitchen atorvastatin (LIPITOR) 80 MG tablet Take 80 mg by mouth daily.        . clopidogrel (PLAVIX) 75 MG tablet Take 75 mg by mouth daily.        . cyclobenzaprine (FLEXERIL) 10 MG tablet Take 10 mg by mouth 3 (three) times daily as needed.        Marland Kitchen esomeprazole (NEXIUM) 40 MG capsule Take 40 mg by mouth daily before breakfast.        . fenofibrate micronized (LOFIBRA) 200 MG capsule Take 200 mg by mouth daily before breakfast.        . glipiZIDE (GLUCOTROL) 10 MG tablet Take 10 mg by mouth daily.        . metFORMIN (GLUMETZA) 500 MG (MOD) 24 hr tablet Take 500 mg by mouth daily with breakfast.        . zolpidem (AMBIEN) 10 MG tablet Take 10 mg by mouth at bedtime as needed.         No current facility-administered medications for this visit.  Review of Systems : [x]  Positive   [ ]  Denies  General:[ ]  Weight loss,  [ ]  Weight gain, [ ]  Loss of appetite, [ ]  Fever, [ ]  chills  Neurologic: [ ]  Dizziness, [ ]  Blackouts, [ ]  Headaches, [ ]  Seizure [ ]  Stroke, [ ]  "Mini stroke", [ ]  Slurred speech, [ ]  Temporary blindness;  [ ] weakness,  Ear/Nose/Throat: [ ]  Change in hearing, [ ]  Nose bleeds, [ ]  Hoarseness  Vascular:[ ]  Pain in legs with walking, [ ]  Pain in feet while lying flat , [ ]   Non-healing ulcer, [ ]  Blood clot in vein,    Pulmonary: [ ]  Home oxygen, [ ]   Productive cough, [ ]  Bronchitis, [ ]  Coughing up blood,  [ ]  Asthma, [ ]  Wheezing  Musculoskeletal:  [ ]  Arthritis, [ ]  Joint pain, [ ]  low back pain  Cardiac: [ ]  Chest pain, [ ]  Shortness of breath when lying flat, [ ]  Shortness of breath with exertion, [ ]  Palpitations, [ ]  Heart murmur, [ ]   Atrial fibrillation  Hematologic:[ ]  Easy Bruising, [  ] Anemia; [ ]  Hepatitis  Psychiatric: [ ]   Depression, [ ]  Anxiety   Gastrointestinal: [ ]  Black stool, [ ]  Blood in stool, [ ]  Peptic ulcer disease,  [ ]  Gastroesophageal Reflux, [ ]  Trouble swallowing, [ ]  Diarrhea, [ ]  Constipation  Urinary: [ ]  chronic Kidney disease, [ ]  on HD, [ ]  Burning with urination, [ ]  Frequent urination, [ ]  Difficulty urinating;   Skin: [ ]  Rashes, [ ]  Wounds    Physical Examination  Filed Vitals:   07/23/13 1331  BP: 140/68  Pulse: 72  Resp:    Filed Weights   07/23/13 1330  Weight: 127 lb (57.607 kg)   Body mass index is 24.8 kg/(m^2).  General: WDWN female in NAD GAIT: normal Eyes: PERRLA Pulmonary:  CTAB, Negative  Rales, Negative rhonchi, & Negative wheezing.  Cardiac: regular Rhythm ,  Negative Murmurs.  VASCULAR EXAM Carotid Bruits Left Right   Negative Negative    . Radial pulses are 2+ palpable and equal.                                                                                                                            LE Pulses LEFT RIGHT       POPLITEAL  not palpable   not palpable       POSTERIOR TIBIAL   palpable    palpable        DORSALIS PEDIS      ANTERIOR TIBIAL  palpable  palpable     Gastrointestinal: soft, nontender, BS WNL, no r/g,  negative masses.  Musculoskeletal: Negative muscle atrophy/wasting. M/S 535 throughout, Extremities without ischemic changes.  Neurologic: A&O X 3; Appropriate Affect ; SENSATION ;normal;  Speech is normal CN 2-12 intact, Pain and light touch intact in extremities, Motor exam as listed above.   Non-Invasive Vascular Imaging CAROTID DUPLEX  07/23/2013   Right ICA: no evidence of stenosis. Left ICA: patent CEA site.  These findings are Unchanged from previous exam.  Assessment: [name] is a 72 y.o. female who presents with asymptomatic with no evidence of stenosis in either internal carotid artery. The  ICA stenosis is  Unchanged from previous exam. Her risk  factors for CVD are uncontrolled diabetes mellitus, hypertension, and dyslipidemia. Patient was advised to work closely with her medical provider for maximal medical management.  Plan: Follow-up in 1 year with Carotid Duplex scan.   I discussed in depth with the patient the nature of atherosclerosis, and emphasized the importance of maximal medical management including strict control of blood pressure, blood glucose, and lipid levels, obtaining regular exercise, and continued cessation of smoking.  The patient is aware that without maximal medical management the underlying atherosclerotic disease process will progress, limiting the benefit of any interventions. The patient was given information about stroke prevention and what symptoms should prompt the patient to seek immediate medical care. Thank you for allowing [place] to participate in this patient's care.  Charisse March, RN, MSN, FNP-C [facility] [place] Office: [phone]  Clinic Physician: Myra Gianotti  07/23/2013 1:30 PM

## 2013-08-21 DIAGNOSIS — W010XXA Fall on same level from slipping, tripping and stumbling without subsequent striking against object, initial encounter: Secondary | ICD-10-CM | POA: Diagnosis not present

## 2013-08-21 DIAGNOSIS — M25559 Pain in unspecified hip: Secondary | ICD-10-CM | POA: Diagnosis not present

## 2013-08-21 DIAGNOSIS — E1149 Type 2 diabetes mellitus with other diabetic neurological complication: Secondary | ICD-10-CM | POA: Diagnosis not present

## 2013-08-21 DIAGNOSIS — R079 Chest pain, unspecified: Secondary | ICD-10-CM | POA: Diagnosis not present

## 2013-08-21 DIAGNOSIS — F5102 Adjustment insomnia: Secondary | ICD-10-CM | POA: Diagnosis not present

## 2013-08-21 DIAGNOSIS — I69959 Hemiplegia and hemiparesis following unspecified cerebrovascular disease affecting unspecified side: Secondary | ICD-10-CM | POA: Diagnosis not present

## 2013-08-21 DIAGNOSIS — G8190 Hemiplegia, unspecified affecting unspecified side: Secondary | ICD-10-CM | POA: Diagnosis not present

## 2013-08-22 DIAGNOSIS — R079 Chest pain, unspecified: Secondary | ICD-10-CM | POA: Diagnosis not present

## 2013-08-22 DIAGNOSIS — S298XXA Other specified injuries of thorax, initial encounter: Secondary | ICD-10-CM | POA: Diagnosis not present

## 2013-08-22 DIAGNOSIS — S79919A Unspecified injury of unspecified hip, initial encounter: Secondary | ICD-10-CM | POA: Diagnosis not present

## 2013-08-22 DIAGNOSIS — M25559 Pain in unspecified hip: Secondary | ICD-10-CM | POA: Diagnosis not present

## 2013-08-22 DIAGNOSIS — M899 Disorder of bone, unspecified: Secondary | ICD-10-CM | POA: Diagnosis not present

## 2013-08-24 DIAGNOSIS — M25819 Other specified joint disorders, unspecified shoulder: Secondary | ICD-10-CM | POA: Diagnosis not present

## 2013-08-24 DIAGNOSIS — M67919 Unspecified disorder of synovium and tendon, unspecified shoulder: Secondary | ICD-10-CM | POA: Diagnosis not present

## 2013-08-24 DIAGNOSIS — S0990XA Unspecified injury of head, initial encounter: Secondary | ICD-10-CM | POA: Diagnosis not present

## 2013-08-24 DIAGNOSIS — R930 Abnormal findings on diagnostic imaging of skull and head, not elsewhere classified: Secondary | ICD-10-CM | POA: Diagnosis not present

## 2013-08-24 DIAGNOSIS — R29818 Other symptoms and signs involving the nervous system: Secondary | ICD-10-CM | POA: Diagnosis not present

## 2013-08-24 DIAGNOSIS — I495 Sick sinus syndrome: Secondary | ICD-10-CM | POA: Diagnosis not present

## 2013-08-24 DIAGNOSIS — M75100 Unspecified rotator cuff tear or rupture of unspecified shoulder, not specified as traumatic: Secondary | ICD-10-CM | POA: Diagnosis not present

## 2013-08-24 DIAGNOSIS — I69959 Hemiplegia and hemiparesis following unspecified cerebrovascular disease affecting unspecified side: Secondary | ICD-10-CM | POA: Diagnosis not present

## 2013-08-24 DIAGNOSIS — I319 Disease of pericardium, unspecified: Secondary | ICD-10-CM | POA: Diagnosis not present

## 2013-08-24 DIAGNOSIS — R29898 Other symptoms and signs involving the musculoskeletal system: Secondary | ICD-10-CM | POA: Diagnosis not present

## 2013-08-29 DIAGNOSIS — M6281 Muscle weakness (generalized): Secondary | ICD-10-CM | POA: Diagnosis not present

## 2013-08-29 DIAGNOSIS — R269 Unspecified abnormalities of gait and mobility: Secondary | ICD-10-CM | POA: Diagnosis not present

## 2013-08-29 DIAGNOSIS — I69992 Facial weakness following unspecified cerebrovascular disease: Secondary | ICD-10-CM | POA: Diagnosis not present

## 2013-08-31 DIAGNOSIS — M6281 Muscle weakness (generalized): Secondary | ICD-10-CM | POA: Diagnosis not present

## 2013-08-31 DIAGNOSIS — I69992 Facial weakness following unspecified cerebrovascular disease: Secondary | ICD-10-CM | POA: Diagnosis not present

## 2013-08-31 DIAGNOSIS — R269 Unspecified abnormalities of gait and mobility: Secondary | ICD-10-CM | POA: Diagnosis not present

## 2013-09-04 DIAGNOSIS — R269 Unspecified abnormalities of gait and mobility: Secondary | ICD-10-CM | POA: Diagnosis not present

## 2013-09-04 DIAGNOSIS — I69992 Facial weakness following unspecified cerebrovascular disease: Secondary | ICD-10-CM | POA: Diagnosis not present

## 2013-09-04 DIAGNOSIS — M6281 Muscle weakness (generalized): Secondary | ICD-10-CM | POA: Diagnosis not present

## 2013-09-07 DIAGNOSIS — M6281 Muscle weakness (generalized): Secondary | ICD-10-CM | POA: Diagnosis not present

## 2013-09-07 DIAGNOSIS — R269 Unspecified abnormalities of gait and mobility: Secondary | ICD-10-CM | POA: Diagnosis not present

## 2013-09-07 DIAGNOSIS — I69992 Facial weakness following unspecified cerebrovascular disease: Secondary | ICD-10-CM | POA: Diagnosis not present

## 2013-09-10 DIAGNOSIS — I69992 Facial weakness following unspecified cerebrovascular disease: Secondary | ICD-10-CM | POA: Diagnosis not present

## 2013-09-10 DIAGNOSIS — R269 Unspecified abnormalities of gait and mobility: Secondary | ICD-10-CM | POA: Diagnosis not present

## 2013-09-10 DIAGNOSIS — M6281 Muscle weakness (generalized): Secondary | ICD-10-CM | POA: Diagnosis not present

## 2013-09-11 DIAGNOSIS — M6281 Muscle weakness (generalized): Secondary | ICD-10-CM | POA: Diagnosis not present

## 2013-09-11 DIAGNOSIS — R269 Unspecified abnormalities of gait and mobility: Secondary | ICD-10-CM | POA: Diagnosis not present

## 2013-09-11 DIAGNOSIS — I69992 Facial weakness following unspecified cerebrovascular disease: Secondary | ICD-10-CM | POA: Diagnosis not present

## 2013-09-18 DIAGNOSIS — R269 Unspecified abnormalities of gait and mobility: Secondary | ICD-10-CM | POA: Diagnosis not present

## 2013-09-18 DIAGNOSIS — M6281 Muscle weakness (generalized): Secondary | ICD-10-CM | POA: Diagnosis not present

## 2013-09-18 DIAGNOSIS — I69992 Facial weakness following unspecified cerebrovascular disease: Secondary | ICD-10-CM | POA: Diagnosis not present

## 2013-09-19 DIAGNOSIS — M6281 Muscle weakness (generalized): Secondary | ICD-10-CM | POA: Diagnosis not present

## 2013-09-19 DIAGNOSIS — R269 Unspecified abnormalities of gait and mobility: Secondary | ICD-10-CM | POA: Diagnosis not present

## 2013-09-19 DIAGNOSIS — I69992 Facial weakness following unspecified cerebrovascular disease: Secondary | ICD-10-CM | POA: Diagnosis not present

## 2013-09-21 DIAGNOSIS — I69992 Facial weakness following unspecified cerebrovascular disease: Secondary | ICD-10-CM | POA: Diagnosis not present

## 2013-09-21 DIAGNOSIS — R269 Unspecified abnormalities of gait and mobility: Secondary | ICD-10-CM | POA: Diagnosis not present

## 2013-09-21 DIAGNOSIS — M6281 Muscle weakness (generalized): Secondary | ICD-10-CM | POA: Diagnosis not present

## 2013-09-24 DIAGNOSIS — F5102 Adjustment insomnia: Secondary | ICD-10-CM | POA: Diagnosis not present

## 2013-09-24 DIAGNOSIS — M25819 Other specified joint disorders, unspecified shoulder: Secondary | ICD-10-CM | POA: Diagnosis not present

## 2013-09-24 DIAGNOSIS — M25559 Pain in unspecified hip: Secondary | ICD-10-CM | POA: Diagnosis not present

## 2013-09-25 DIAGNOSIS — R269 Unspecified abnormalities of gait and mobility: Secondary | ICD-10-CM | POA: Diagnosis not present

## 2013-09-25 DIAGNOSIS — I69992 Facial weakness following unspecified cerebrovascular disease: Secondary | ICD-10-CM | POA: Diagnosis not present

## 2013-09-25 DIAGNOSIS — M6281 Muscle weakness (generalized): Secondary | ICD-10-CM | POA: Diagnosis not present

## 2013-09-27 DIAGNOSIS — I69992 Facial weakness following unspecified cerebrovascular disease: Secondary | ICD-10-CM | POA: Diagnosis not present

## 2013-09-27 DIAGNOSIS — R269 Unspecified abnormalities of gait and mobility: Secondary | ICD-10-CM | POA: Diagnosis not present

## 2013-09-27 DIAGNOSIS — M6281 Muscle weakness (generalized): Secondary | ICD-10-CM | POA: Diagnosis not present

## 2013-09-28 DIAGNOSIS — M6281 Muscle weakness (generalized): Secondary | ICD-10-CM | POA: Diagnosis not present

## 2013-09-28 DIAGNOSIS — R269 Unspecified abnormalities of gait and mobility: Secondary | ICD-10-CM | POA: Diagnosis not present

## 2013-09-28 DIAGNOSIS — I69992 Facial weakness following unspecified cerebrovascular disease: Secondary | ICD-10-CM | POA: Diagnosis not present

## 2013-10-02 DIAGNOSIS — R269 Unspecified abnormalities of gait and mobility: Secondary | ICD-10-CM | POA: Diagnosis not present

## 2013-10-02 DIAGNOSIS — I69992 Facial weakness following unspecified cerebrovascular disease: Secondary | ICD-10-CM | POA: Diagnosis not present

## 2013-10-02 DIAGNOSIS — M6281 Muscle weakness (generalized): Secondary | ICD-10-CM | POA: Diagnosis not present

## 2013-10-04 DIAGNOSIS — I69992 Facial weakness following unspecified cerebrovascular disease: Secondary | ICD-10-CM | POA: Diagnosis not present

## 2013-10-04 DIAGNOSIS — M6281 Muscle weakness (generalized): Secondary | ICD-10-CM | POA: Diagnosis not present

## 2013-10-04 DIAGNOSIS — R269 Unspecified abnormalities of gait and mobility: Secondary | ICD-10-CM | POA: Diagnosis not present

## 2013-10-08 DIAGNOSIS — I69992 Facial weakness following unspecified cerebrovascular disease: Secondary | ICD-10-CM | POA: Diagnosis not present

## 2013-10-08 DIAGNOSIS — M6281 Muscle weakness (generalized): Secondary | ICD-10-CM | POA: Diagnosis not present

## 2013-10-08 DIAGNOSIS — R269 Unspecified abnormalities of gait and mobility: Secondary | ICD-10-CM | POA: Diagnosis not present

## 2013-10-09 DIAGNOSIS — M6281 Muscle weakness (generalized): Secondary | ICD-10-CM | POA: Diagnosis not present

## 2013-10-09 DIAGNOSIS — I69992 Facial weakness following unspecified cerebrovascular disease: Secondary | ICD-10-CM | POA: Diagnosis not present

## 2013-10-09 DIAGNOSIS — R269 Unspecified abnormalities of gait and mobility: Secondary | ICD-10-CM | POA: Diagnosis not present

## 2013-10-12 DIAGNOSIS — M6281 Muscle weakness (generalized): Secondary | ICD-10-CM | POA: Diagnosis not present

## 2013-10-12 DIAGNOSIS — R269 Unspecified abnormalities of gait and mobility: Secondary | ICD-10-CM | POA: Diagnosis not present

## 2013-10-12 DIAGNOSIS — I69992 Facial weakness following unspecified cerebrovascular disease: Secondary | ICD-10-CM | POA: Diagnosis not present

## 2013-10-16 DIAGNOSIS — R269 Unspecified abnormalities of gait and mobility: Secondary | ICD-10-CM | POA: Diagnosis not present

## 2013-10-16 DIAGNOSIS — M6281 Muscle weakness (generalized): Secondary | ICD-10-CM | POA: Diagnosis not present

## 2013-10-16 DIAGNOSIS — I69992 Facial weakness following unspecified cerebrovascular disease: Secondary | ICD-10-CM | POA: Diagnosis not present

## 2013-10-24 DIAGNOSIS — M2550 Pain in unspecified joint: Secondary | ICD-10-CM | POA: Diagnosis not present

## 2013-10-24 DIAGNOSIS — E1149 Type 2 diabetes mellitus with other diabetic neurological complication: Secondary | ICD-10-CM | POA: Diagnosis not present

## 2013-10-24 DIAGNOSIS — Z1211 Encounter for screening for malignant neoplasm of colon: Secondary | ICD-10-CM | POA: Diagnosis not present

## 2013-10-24 DIAGNOSIS — R059 Cough, unspecified: Secondary | ICD-10-CM | POA: Diagnosis not present

## 2013-10-24 DIAGNOSIS — E7800 Pure hypercholesterolemia, unspecified: Secondary | ICD-10-CM | POA: Diagnosis not present

## 2013-10-24 DIAGNOSIS — I10 Essential (primary) hypertension: Secondary | ICD-10-CM | POA: Diagnosis not present

## 2013-10-24 DIAGNOSIS — R05 Cough: Secondary | ICD-10-CM | POA: Diagnosis not present

## 2013-10-24 DIAGNOSIS — F341 Dysthymic disorder: Secondary | ICD-10-CM | POA: Diagnosis not present

## 2013-10-24 DIAGNOSIS — R61 Generalized hyperhidrosis: Secondary | ICD-10-CM | POA: Diagnosis not present

## 2013-10-24 DIAGNOSIS — E119 Type 2 diabetes mellitus without complications: Secondary | ICD-10-CM | POA: Diagnosis not present

## 2013-10-24 DIAGNOSIS — Z1231 Encounter for screening mammogram for malignant neoplasm of breast: Secondary | ICD-10-CM | POA: Diagnosis not present

## 2013-10-25 DIAGNOSIS — R05 Cough: Secondary | ICD-10-CM | POA: Diagnosis not present

## 2013-10-25 DIAGNOSIS — R059 Cough, unspecified: Secondary | ICD-10-CM | POA: Diagnosis not present

## 2013-10-25 DIAGNOSIS — Z1231 Encounter for screening mammogram for malignant neoplasm of breast: Secondary | ICD-10-CM | POA: Diagnosis not present

## 2013-10-30 DIAGNOSIS — M6281 Muscle weakness (generalized): Secondary | ICD-10-CM | POA: Diagnosis not present

## 2013-10-30 DIAGNOSIS — I69992 Facial weakness following unspecified cerebrovascular disease: Secondary | ICD-10-CM | POA: Diagnosis not present

## 2013-10-30 DIAGNOSIS — R269 Unspecified abnormalities of gait and mobility: Secondary | ICD-10-CM | POA: Diagnosis not present

## 2013-11-01 DIAGNOSIS — R269 Unspecified abnormalities of gait and mobility: Secondary | ICD-10-CM | POA: Diagnosis not present

## 2013-11-01 DIAGNOSIS — M6281 Muscle weakness (generalized): Secondary | ICD-10-CM | POA: Diagnosis not present

## 2013-11-01 DIAGNOSIS — I69992 Facial weakness following unspecified cerebrovascular disease: Secondary | ICD-10-CM | POA: Diagnosis not present

## 2013-11-12 DIAGNOSIS — IMO0001 Reserved for inherently not codable concepts without codable children: Secondary | ICD-10-CM | POA: Diagnosis not present

## 2013-11-12 DIAGNOSIS — J45909 Unspecified asthma, uncomplicated: Secondary | ICD-10-CM | POA: Diagnosis not present

## 2013-11-12 DIAGNOSIS — R5381 Other malaise: Secondary | ICD-10-CM | POA: Diagnosis not present

## 2013-11-12 DIAGNOSIS — R61 Generalized hyperhidrosis: Secondary | ICD-10-CM | POA: Diagnosis not present

## 2013-11-12 DIAGNOSIS — R5383 Other fatigue: Secondary | ICD-10-CM | POA: Diagnosis not present

## 2013-11-12 DIAGNOSIS — M549 Dorsalgia, unspecified: Secondary | ICD-10-CM | POA: Diagnosis not present

## 2014-02-08 DIAGNOSIS — F341 Dysthymic disorder: Secondary | ICD-10-CM | POA: Diagnosis not present

## 2014-02-08 DIAGNOSIS — J45909 Unspecified asthma, uncomplicated: Secondary | ICD-10-CM | POA: Diagnosis not present

## 2014-02-08 DIAGNOSIS — R5383 Other fatigue: Secondary | ICD-10-CM | POA: Diagnosis not present

## 2014-02-08 DIAGNOSIS — R5381 Other malaise: Secondary | ICD-10-CM | POA: Diagnosis not present

## 2014-02-08 DIAGNOSIS — E1149 Type 2 diabetes mellitus with other diabetic neurological complication: Secondary | ICD-10-CM | POA: Diagnosis not present

## 2014-02-08 DIAGNOSIS — R61 Generalized hyperhidrosis: Secondary | ICD-10-CM | POA: Diagnosis not present

## 2014-02-08 DIAGNOSIS — E119 Type 2 diabetes mellitus without complications: Secondary | ICD-10-CM | POA: Diagnosis not present

## 2014-02-08 DIAGNOSIS — R079 Chest pain, unspecified: Secondary | ICD-10-CM | POA: Diagnosis not present

## 2014-02-08 DIAGNOSIS — M25559 Pain in unspecified hip: Secondary | ICD-10-CM | POA: Diagnosis not present

## 2014-02-08 DIAGNOSIS — M25519 Pain in unspecified shoulder: Secondary | ICD-10-CM | POA: Diagnosis not present

## 2014-02-12 DIAGNOSIS — M25559 Pain in unspecified hip: Secondary | ICD-10-CM | POA: Diagnosis not present

## 2014-02-12 DIAGNOSIS — R079 Chest pain, unspecified: Secondary | ICD-10-CM | POA: Diagnosis not present

## 2014-02-12 DIAGNOSIS — M25519 Pain in unspecified shoulder: Secondary | ICD-10-CM | POA: Diagnosis not present

## 2014-02-14 DIAGNOSIS — R079 Chest pain, unspecified: Secondary | ICD-10-CM | POA: Diagnosis not present

## 2014-02-14 DIAGNOSIS — M25559 Pain in unspecified hip: Secondary | ICD-10-CM | POA: Diagnosis not present

## 2014-02-14 DIAGNOSIS — M25519 Pain in unspecified shoulder: Secondary | ICD-10-CM | POA: Diagnosis not present

## 2014-02-25 DIAGNOSIS — M25559 Pain in unspecified hip: Secondary | ICD-10-CM | POA: Diagnosis not present

## 2014-02-25 DIAGNOSIS — M25519 Pain in unspecified shoulder: Secondary | ICD-10-CM | POA: Diagnosis not present

## 2014-02-25 DIAGNOSIS — R079 Chest pain, unspecified: Secondary | ICD-10-CM | POA: Diagnosis not present

## 2014-02-26 DIAGNOSIS — M25519 Pain in unspecified shoulder: Secondary | ICD-10-CM | POA: Diagnosis not present

## 2014-02-26 DIAGNOSIS — R079 Chest pain, unspecified: Secondary | ICD-10-CM | POA: Diagnosis not present

## 2014-02-26 DIAGNOSIS — M25559 Pain in unspecified hip: Secondary | ICD-10-CM | POA: Diagnosis not present

## 2014-03-01 DIAGNOSIS — M25519 Pain in unspecified shoulder: Secondary | ICD-10-CM | POA: Diagnosis not present

## 2014-03-01 DIAGNOSIS — R079 Chest pain, unspecified: Secondary | ICD-10-CM | POA: Diagnosis not present

## 2014-03-01 DIAGNOSIS — M25559 Pain in unspecified hip: Secondary | ICD-10-CM | POA: Diagnosis not present

## 2014-03-04 DIAGNOSIS — M25519 Pain in unspecified shoulder: Secondary | ICD-10-CM | POA: Diagnosis not present

## 2014-03-04 DIAGNOSIS — M25559 Pain in unspecified hip: Secondary | ICD-10-CM | POA: Diagnosis not present

## 2014-03-04 DIAGNOSIS — R079 Chest pain, unspecified: Secondary | ICD-10-CM | POA: Diagnosis not present

## 2014-03-07 DIAGNOSIS — R079 Chest pain, unspecified: Secondary | ICD-10-CM | POA: Diagnosis not present

## 2014-03-07 DIAGNOSIS — M25519 Pain in unspecified shoulder: Secondary | ICD-10-CM | POA: Diagnosis not present

## 2014-03-07 DIAGNOSIS — M25559 Pain in unspecified hip: Secondary | ICD-10-CM | POA: Diagnosis not present

## 2014-03-11 DIAGNOSIS — M25519 Pain in unspecified shoulder: Secondary | ICD-10-CM | POA: Diagnosis not present

## 2014-03-11 DIAGNOSIS — M25559 Pain in unspecified hip: Secondary | ICD-10-CM | POA: Diagnosis not present

## 2014-03-11 DIAGNOSIS — R079 Chest pain, unspecified: Secondary | ICD-10-CM | POA: Diagnosis not present

## 2014-03-15 DIAGNOSIS — M25519 Pain in unspecified shoulder: Secondary | ICD-10-CM | POA: Diagnosis not present

## 2014-03-15 DIAGNOSIS — R079 Chest pain, unspecified: Secondary | ICD-10-CM | POA: Diagnosis not present

## 2014-03-15 DIAGNOSIS — M25559 Pain in unspecified hip: Secondary | ICD-10-CM | POA: Diagnosis not present

## 2014-03-20 DIAGNOSIS — R079 Chest pain, unspecified: Secondary | ICD-10-CM | POA: Diagnosis not present

## 2014-03-20 DIAGNOSIS — M25559 Pain in unspecified hip: Secondary | ICD-10-CM | POA: Diagnosis not present

## 2014-03-20 DIAGNOSIS — M25519 Pain in unspecified shoulder: Secondary | ICD-10-CM | POA: Diagnosis not present

## 2014-03-26 DIAGNOSIS — M25519 Pain in unspecified shoulder: Secondary | ICD-10-CM | POA: Diagnosis not present

## 2014-03-26 DIAGNOSIS — M25559 Pain in unspecified hip: Secondary | ICD-10-CM | POA: Diagnosis not present

## 2014-03-26 DIAGNOSIS — R079 Chest pain, unspecified: Secondary | ICD-10-CM | POA: Diagnosis not present

## 2014-03-28 DIAGNOSIS — R079 Chest pain, unspecified: Secondary | ICD-10-CM | POA: Diagnosis not present

## 2014-03-28 DIAGNOSIS — M25559 Pain in unspecified hip: Secondary | ICD-10-CM | POA: Diagnosis not present

## 2014-03-28 DIAGNOSIS — M25519 Pain in unspecified shoulder: Secondary | ICD-10-CM | POA: Diagnosis not present

## 2014-04-01 DIAGNOSIS — M25559 Pain in unspecified hip: Secondary | ICD-10-CM | POA: Diagnosis not present

## 2014-04-01 DIAGNOSIS — M25519 Pain in unspecified shoulder: Secondary | ICD-10-CM | POA: Diagnosis not present

## 2014-04-01 DIAGNOSIS — R079 Chest pain, unspecified: Secondary | ICD-10-CM | POA: Diagnosis not present

## 2014-04-04 DIAGNOSIS — M25559 Pain in unspecified hip: Secondary | ICD-10-CM | POA: Diagnosis not present

## 2014-04-04 DIAGNOSIS — M25519 Pain in unspecified shoulder: Secondary | ICD-10-CM | POA: Diagnosis not present

## 2014-04-04 DIAGNOSIS — R079 Chest pain, unspecified: Secondary | ICD-10-CM | POA: Diagnosis not present

## 2014-04-08 DIAGNOSIS — M25559 Pain in unspecified hip: Secondary | ICD-10-CM | POA: Diagnosis not present

## 2014-04-08 DIAGNOSIS — F341 Dysthymic disorder: Secondary | ICD-10-CM | POA: Diagnosis not present

## 2014-04-08 DIAGNOSIS — J45909 Unspecified asthma, uncomplicated: Secondary | ICD-10-CM | POA: Diagnosis not present

## 2014-04-08 DIAGNOSIS — M479 Spondylosis, unspecified: Secondary | ICD-10-CM | POA: Diagnosis not present

## 2014-04-08 DIAGNOSIS — F5102 Adjustment insomnia: Secondary | ICD-10-CM | POA: Diagnosis not present

## 2014-04-08 DIAGNOSIS — M25519 Pain in unspecified shoulder: Secondary | ICD-10-CM | POA: Diagnosis not present

## 2014-04-08 DIAGNOSIS — R61 Generalized hyperhidrosis: Secondary | ICD-10-CM | POA: Diagnosis not present

## 2014-04-11 DIAGNOSIS — M25559 Pain in unspecified hip: Secondary | ICD-10-CM | POA: Diagnosis not present

## 2014-04-11 DIAGNOSIS — M25519 Pain in unspecified shoulder: Secondary | ICD-10-CM | POA: Diagnosis not present

## 2014-04-11 DIAGNOSIS — R079 Chest pain, unspecified: Secondary | ICD-10-CM | POA: Diagnosis not present

## 2014-04-15 DIAGNOSIS — M25519 Pain in unspecified shoulder: Secondary | ICD-10-CM | POA: Diagnosis not present

## 2014-04-15 DIAGNOSIS — M25559 Pain in unspecified hip: Secondary | ICD-10-CM | POA: Diagnosis not present

## 2014-04-15 DIAGNOSIS — R079 Chest pain, unspecified: Secondary | ICD-10-CM | POA: Diagnosis not present

## 2014-04-22 DIAGNOSIS — M25559 Pain in unspecified hip: Secondary | ICD-10-CM | POA: Diagnosis not present

## 2014-04-22 DIAGNOSIS — R079 Chest pain, unspecified: Secondary | ICD-10-CM | POA: Diagnosis not present

## 2014-04-22 DIAGNOSIS — M25519 Pain in unspecified shoulder: Secondary | ICD-10-CM | POA: Diagnosis not present

## 2014-04-29 DIAGNOSIS — R079 Chest pain, unspecified: Secondary | ICD-10-CM | POA: Diagnosis not present

## 2014-04-29 DIAGNOSIS — M25559 Pain in unspecified hip: Secondary | ICD-10-CM | POA: Diagnosis not present

## 2014-04-29 DIAGNOSIS — M25519 Pain in unspecified shoulder: Secondary | ICD-10-CM | POA: Diagnosis not present

## 2014-05-02 DIAGNOSIS — M25559 Pain in unspecified hip: Secondary | ICD-10-CM | POA: Diagnosis not present

## 2014-05-02 DIAGNOSIS — M25519 Pain in unspecified shoulder: Secondary | ICD-10-CM | POA: Diagnosis not present

## 2014-05-02 DIAGNOSIS — R079 Chest pain, unspecified: Secondary | ICD-10-CM | POA: Diagnosis not present

## 2014-05-08 DIAGNOSIS — M25519 Pain in unspecified shoulder: Secondary | ICD-10-CM | POA: Diagnosis not present

## 2014-05-08 DIAGNOSIS — M25559 Pain in unspecified hip: Secondary | ICD-10-CM | POA: Diagnosis not present

## 2014-05-08 DIAGNOSIS — R079 Chest pain, unspecified: Secondary | ICD-10-CM | POA: Diagnosis not present

## 2014-05-09 DIAGNOSIS — R1319 Other dysphagia: Secondary | ICD-10-CM | POA: Diagnosis not present

## 2014-05-09 DIAGNOSIS — E1149 Type 2 diabetes mellitus with other diabetic neurological complication: Secondary | ICD-10-CM | POA: Diagnosis not present

## 2014-05-09 DIAGNOSIS — R1013 Epigastric pain: Secondary | ICD-10-CM | POA: Diagnosis not present

## 2014-05-09 DIAGNOSIS — R197 Diarrhea, unspecified: Secondary | ICD-10-CM | POA: Diagnosis not present

## 2014-05-09 DIAGNOSIS — E7800 Pure hypercholesterolemia, unspecified: Secondary | ICD-10-CM | POA: Diagnosis not present

## 2014-05-09 DIAGNOSIS — R634 Abnormal weight loss: Secondary | ICD-10-CM | POA: Diagnosis not present

## 2014-05-09 DIAGNOSIS — M25519 Pain in unspecified shoulder: Secondary | ICD-10-CM | POA: Diagnosis not present

## 2014-05-14 DIAGNOSIS — R079 Chest pain, unspecified: Secondary | ICD-10-CM | POA: Diagnosis not present

## 2014-05-14 DIAGNOSIS — M25559 Pain in unspecified hip: Secondary | ICD-10-CM | POA: Diagnosis not present

## 2014-05-14 DIAGNOSIS — M25519 Pain in unspecified shoulder: Secondary | ICD-10-CM | POA: Diagnosis not present

## 2014-05-16 DIAGNOSIS — E7800 Pure hypercholesterolemia, unspecified: Secondary | ICD-10-CM | POA: Diagnosis not present

## 2014-05-16 DIAGNOSIS — E1149 Type 2 diabetes mellitus with other diabetic neurological complication: Secondary | ICD-10-CM | POA: Diagnosis not present

## 2014-05-28 DIAGNOSIS — K229 Disease of esophagus, unspecified: Secondary | ICD-10-CM | POA: Diagnosis not present

## 2014-05-28 DIAGNOSIS — R1319 Other dysphagia: Secondary | ICD-10-CM | POA: Diagnosis not present

## 2014-06-06 DIAGNOSIS — D500 Iron deficiency anemia secondary to blood loss (chronic): Secondary | ICD-10-CM | POA: Diagnosis not present

## 2014-06-06 DIAGNOSIS — R634 Abnormal weight loss: Secondary | ICD-10-CM | POA: Diagnosis not present

## 2014-06-06 DIAGNOSIS — R197 Diarrhea, unspecified: Secondary | ICD-10-CM | POA: Diagnosis not present

## 2014-06-10 DIAGNOSIS — H538 Other visual disturbances: Secondary | ICD-10-CM | POA: Diagnosis not present

## 2014-06-12 DIAGNOSIS — K5730 Diverticulosis of large intestine without perforation or abscess without bleeding: Secondary | ICD-10-CM | POA: Diagnosis not present

## 2014-06-12 DIAGNOSIS — K5289 Other specified noninfective gastroenteritis and colitis: Secondary | ICD-10-CM | POA: Diagnosis not present

## 2014-06-12 DIAGNOSIS — J449 Chronic obstructive pulmonary disease, unspecified: Secondary | ICD-10-CM | POA: Diagnosis not present

## 2014-06-12 DIAGNOSIS — E119 Type 2 diabetes mellitus without complications: Secondary | ICD-10-CM | POA: Diagnosis not present

## 2014-06-12 DIAGNOSIS — Z8673 Personal history of transient ischemic attack (TIA), and cerebral infarction without residual deficits: Secondary | ICD-10-CM | POA: Diagnosis not present

## 2014-06-12 DIAGNOSIS — E785 Hyperlipidemia, unspecified: Secondary | ICD-10-CM | POA: Diagnosis not present

## 2014-06-12 DIAGNOSIS — D126 Benign neoplasm of colon, unspecified: Secondary | ICD-10-CM | POA: Diagnosis not present

## 2014-06-12 DIAGNOSIS — R197 Diarrhea, unspecified: Secondary | ICD-10-CM | POA: Diagnosis not present

## 2014-06-12 DIAGNOSIS — R634 Abnormal weight loss: Secondary | ICD-10-CM | POA: Diagnosis not present

## 2014-06-12 DIAGNOSIS — K648 Other hemorrhoids: Secondary | ICD-10-CM | POA: Diagnosis not present

## 2014-06-12 DIAGNOSIS — I10 Essential (primary) hypertension: Secondary | ICD-10-CM | POA: Diagnosis not present

## 2014-06-12 DIAGNOSIS — D500 Iron deficiency anemia secondary to blood loss (chronic): Secondary | ICD-10-CM | POA: Diagnosis not present

## 2014-06-12 DIAGNOSIS — K591 Functional diarrhea: Secondary | ICD-10-CM | POA: Diagnosis not present

## 2014-06-20 DIAGNOSIS — Z23 Encounter for immunization: Secondary | ICD-10-CM | POA: Diagnosis not present

## 2014-07-26 ENCOUNTER — Encounter: Payer: Self-pay | Admitting: Family

## 2014-07-29 ENCOUNTER — Other Ambulatory Visit (HOSPITAL_COMMUNITY): Payer: Medicare Other

## 2014-07-29 ENCOUNTER — Ambulatory Visit: Payer: Medicare Other | Admitting: Family

## 2014-08-02 ENCOUNTER — Encounter: Payer: Self-pay | Admitting: Vascular Surgery

## 2014-08-05 ENCOUNTER — Encounter: Payer: Self-pay | Admitting: Family

## 2014-08-05 ENCOUNTER — Ambulatory Visit (INDEPENDENT_AMBULATORY_CARE_PROVIDER_SITE_OTHER): Payer: Medicare Other | Admitting: Family

## 2014-08-05 ENCOUNTER — Ambulatory Visit (HOSPITAL_COMMUNITY)
Admission: RE | Admit: 2014-08-05 | Discharge: 2014-08-05 | Disposition: A | Discharge status: Home / Self Care | Payer: Medicare Other | Source: Ambulatory Visit | Attending: Family | Admitting: Family

## 2014-08-05 VITALS — BP 118/63 | HR 78 | Resp 16 | Ht 62.0 in | Wt 107.0 lb

## 2014-08-05 DIAGNOSIS — Z9889 Other specified postprocedural states: Secondary | ICD-10-CM | POA: Diagnosis not present

## 2014-08-05 DIAGNOSIS — I6523 Occlusion and stenosis of bilateral carotid arteries: Secondary | ICD-10-CM | POA: Diagnosis not present

## 2014-08-05 DIAGNOSIS — Z48812 Encounter for surgical aftercare following surgery on the circulatory system: Secondary | ICD-10-CM | POA: Diagnosis not present

## 2014-08-05 NOTE — Progress Notes (Signed)
Established Carotid Patient   History of Present Illness  [name] is a 73 y.o. female patient of Dr. Trula Slade status post left CEA in 2009. She returns today for follow up.  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 All strokes manifested as right side weakness.  Has residual right arm and leg weakness and has fallen. Has trouble with her asthma. Patient denies heart problems, denies claudication symptoms, denies non healing wounds. Still has neck and right arm pain that she attributes to c-spine issues, is also wearing lumbar spine brace. Always feels hot, reports weight loss but is gaining some weight recently. She denies post prandial abdominal pain. She states that she is physically active  The patient denies amaurosis fugax or monocular blindness. The patient reports facial drooping only after c-spine surgery which has since resolved. The patient reports mild continued expressive aphasia.   Patient reports New Medical or Surgical History: had a colonoscopy about Sept., 2015, states she does not know results  Pt Diabetic: Yes, states in good control Pt smoker: former smoker, quit about 2000  Pt meds include: Statin : Yes ASA: 81 mg daily Other anticoagulants/antiplatelets: Plavix  Past Medical History  Diagnosis Date  . Hyperlipidemia   . Stroke   . GERD (gastroesophageal reflux disease)   . Arthritis   . Asthma   . Leg pain   . Anxiety   . Anemia   . Carotid artery occlusion   . Diabetes mellitus 59  . Varicose veins     Social History History  Substance Use Topics  . Smoking status: Former Smoker    Types: Cigarettes    Quit date: 09/20/1996  . Smokeless tobacco: Never Used  . Alcohol Use: No    Family History Family History  Problem Relation Age of Onset  . Stroke Other   . Heart disease Other   . Cancer Father     Colon  . Diabetes Daughter   . Hyperlipidemia Daughter   . Varicose Veins Daughter     Surgical History Past  Surgical History  Procedure Laterality Date  . Carotid endarterectomy  07/26/2008    left  . Cholecystectomy      No Known Allergies  Current Outpatient Prescriptions  Medication Sig Dispense Refill  . albuterol (PROVENTIL HFA;VENTOLIN HFA) 108 (90 BASE) MCG/ACT inhaler Inhale 2 puffs into the lungs every 6 (six) hours as needed.      Marland Kitchen aspirin EC 81 MG tablet Take 81 mg by mouth daily.      Marland Kitchen atorvastatin (LIPITOR) 80 MG tablet Take 80 mg by mouth daily.      . cloNIDine (CATAPRES) 0.1 MG tablet Take 0.1 mg by mouth 2 (two) times daily.    . clopidogrel (PLAVIX) 75 MG tablet Take 75 mg by mouth daily.      Marland Kitchen esomeprazole (NEXIUM) 40 MG capsule Take 40 mg by mouth daily before breakfast.      . fenofibrate micronized (LOFIBRA) 200 MG capsule Take 200 mg by mouth daily before breakfast.      . Fluticasone Furoate-Vilanterol 100-25 MCG/INH AEPB Inhale into the lungs.    Marland Kitchen HYDROcodone-acetaminophen (NORCO/VICODIN) 5-325 MG per tablet Take 1 tablet by mouth as needed for moderate pain. Hip and Shoulder pain    . metFORMIN (GLUMETZA) 500 MG (MOD) 24 hr tablet Take 500 mg by mouth daily with breakfast.      . promethazine (PHENERGAN) 25 MG tablet Take 25 mg by mouth every 6 (six)  hours as needed for nausea or vomiting.    . sertraline (ZOLOFT) 100 MG tablet Take 100 mg by mouth daily.    Marland Kitchen zolpidem (AMBIEN) 10 MG tablet Take 10 mg by mouth at bedtime as needed.      . cyclobenzaprine (FLEXERIL) 10 MG tablet Take 10 mg by mouth 3 (three) times daily as needed.      Marland Kitchen glipiZIDE (GLUCOTROL) 10 MG tablet Take 10 mg by mouth daily.       No current facility-administered medications for this visit.    Review of Systems : See HPI for pertinent positives and negatives.  Physical Examination  Filed Vitals:   08/05/14 1404 08/05/14 1407  BP: 124/66 118/63  Pulse: 78 78  Resp:  16  Height:  5\' 2"  (1.575 m)  Weight:  107 lb (48.535 kg)  SpO2:  100%   Body mass index is 19.57  kg/(m^2).  General: WDWN female in NAD GAIT: normal Eyes: PERRLA Pulmonary: CTAB, Negative Rales, Negative rhonchi, & Negative wheezing.  Cardiac: regular Rhythm, no detected murmur.  VASCULAR EXAM Carotid Bruits Left Right   Negative Negative   . Radial pulses are 2+ palpable and equal.      LE Pulses LEFT RIGHT   POPLITEAL not palpable  not palpable   POSTERIOR TIBIAL  palpable   palpable    DORSALIS PEDIS  ANTERIOR TIBIAL not palpable  Not palpable     Gastrointestinal: soft, nontender, BS WNL, no r/g, no palpable masses.  Musculoskeletal: Negative muscle atrophy/wasting. M/S 5/5 throughout, Extremities without ischemic changes.  Neurologic: A&O X 3; Appropriate Affect ; SENSATION ;normal;  Speech is normal CN 2-12 intact, Pain and light touch intact in extremities, Motor exam as listed above.    Non-Invasive Vascular Imaging CAROTID DUPLEX 08/05/2014   CEREBROVASCULAR DUPLEX EVALUATION    INDICATION: Follow-up carotid disease     PREVIOUS INTERVENTION(S): Left carotid endarterectomy 07/26/2008    DUPLEX EXAM:     RIGHT  LEFT  Peak Systolic Velocities (cm/s) End Diastolic Velocities (cm/s) Plaque LOCATION Peak Systolic Velocities (cm/s) End Diastolic Velocities (cm/s) Plaque  84 16  CCA PROXIMAL 88 20   79 19  CCA MID 86 23   74 18  CCA DISTAL 91 25   77 9  ECA 93 13   85 26  ICA PROXIMAL 124 28   100 31  ICA MID 134 35   130 40  ICA DISTAL 149 37     1.1 ICA / CCA Ratio (PSV) NA  Antegrade  Vertebral Flow Antegrade    Brachial Systolic Pressure (mmHg)   Within normal limits  Brachial Artery Waveforms Within normal limits     Plaque Morphology:  HM = Homogeneous, HT = Heterogeneous, CP = Calcific Plaque, SP = Smooth Plaque, IP = Irregular Plaque  ADDITIONAL FINDINGS:      IMPRESSION: 1. Evidence of minimal (<40%) plaque of the right internal carotid artery. 2. Widely patent left carotid endarterectomy without evidence of restenosis or hyperplasia. 3. Bilateral vertebral artery is antegrade.     Compared to the previous exam:  No significant change compared to prior exam.      Assessment: [name] is a 73 y.o. female who presents with asymptomatic minimal right ICA stenosis and a patent left ICA/CEA site. The  ICA stenosis is  Unchanged from previous exam.   Plan: Follow-up in 1 year with Carotid Duplex scan.   I discussed in depth with the patient the nature of  atherosclerosis, and emphasized the importance of maximal medical management including strict control of blood pressure, blood glucose, and lipid levels, obtaining regular exercise, and continued cessation of smoking.  The patient is aware that without maximal medical management the underlying atherosclerotic disease process will progress, limiting the benefit of any interventions. The patient was given information about stroke prevention and what symptoms should prompt the patient to seek immediate medical care. Thank you for allowing [place] to participate in this patient's care.  Clemon Chambers, RN, MSN, FNP-C [facility] [place] Office: [phone]  Clinic Physician: Trula Slade  08/05/2014 2:09 PM

## 2014-08-05 NOTE — Addendum Note (Signed)
Addended by: Mena Goes on: 08/05/2014 03:46 PM   Modules accepted: Orders

## 2014-08-07 DIAGNOSIS — R5383 Other fatigue: Secondary | ICD-10-CM | POA: Diagnosis not present

## 2014-08-07 DIAGNOSIS — N3000 Acute cystitis without hematuria: Secondary | ICD-10-CM | POA: Diagnosis not present

## 2014-08-07 DIAGNOSIS — R195 Other fecal abnormalities: Secondary | ICD-10-CM | POA: Diagnosis not present

## 2014-09-04 DIAGNOSIS — E1140 Type 2 diabetes mellitus with diabetic neuropathy, unspecified: Secondary | ICD-10-CM | POA: Diagnosis not present

## 2014-09-04 DIAGNOSIS — R0789 Other chest pain: Secondary | ICD-10-CM | POA: Diagnosis not present

## 2014-09-04 DIAGNOSIS — R1032 Left lower quadrant pain: Secondary | ICD-10-CM | POA: Diagnosis not present

## 2014-09-04 DIAGNOSIS — R079 Chest pain, unspecified: Secondary | ICD-10-CM | POA: Diagnosis not present

## 2014-09-04 DIAGNOSIS — R10814 Left lower quadrant abdominal tenderness: Secondary | ICD-10-CM | POA: Diagnosis not present

## 2014-09-04 DIAGNOSIS — R634 Abnormal weight loss: Secondary | ICD-10-CM | POA: Diagnosis not present

## 2014-09-04 DIAGNOSIS — E780 Pure hypercholesterolemia: Secondary | ICD-10-CM | POA: Diagnosis not present

## 2014-09-10 DIAGNOSIS — R0789 Other chest pain: Secondary | ICD-10-CM | POA: Diagnosis not present

## 2014-12-04 DIAGNOSIS — E1140 Type 2 diabetes mellitus with diabetic neuropathy, unspecified: Secondary | ICD-10-CM | POA: Diagnosis not present

## 2014-12-04 DIAGNOSIS — E119 Type 2 diabetes mellitus without complications: Secondary | ICD-10-CM | POA: Diagnosis not present

## 2014-12-04 DIAGNOSIS — E780 Pure hypercholesterolemia: Secondary | ICD-10-CM | POA: Diagnosis not present

## 2014-12-04 DIAGNOSIS — M25512 Pain in left shoulder: Secondary | ICD-10-CM | POA: Diagnosis not present

## 2014-12-04 DIAGNOSIS — M546 Pain in thoracic spine: Secondary | ICD-10-CM | POA: Diagnosis not present

## 2014-12-04 DIAGNOSIS — M545 Low back pain: Secondary | ICD-10-CM | POA: Diagnosis not present

## 2014-12-04 DIAGNOSIS — R0789 Other chest pain: Secondary | ICD-10-CM | POA: Diagnosis not present

## 2014-12-04 DIAGNOSIS — R079 Chest pain, unspecified: Secondary | ICD-10-CM | POA: Diagnosis not present

## 2014-12-04 DIAGNOSIS — I10 Essential (primary) hypertension: Secondary | ICD-10-CM | POA: Diagnosis not present

## 2015-01-02 DIAGNOSIS — M25522 Pain in left elbow: Secondary | ICD-10-CM | POA: Diagnosis not present

## 2015-01-14 DIAGNOSIS — M7712 Lateral epicondylitis, left elbow: Secondary | ICD-10-CM | POA: Diagnosis not present

## 2015-03-13 DIAGNOSIS — M545 Low back pain: Secondary | ICD-10-CM | POA: Diagnosis not present

## 2015-03-13 DIAGNOSIS — E1321 Other specified diabetes mellitus with diabetic nephropathy: Secondary | ICD-10-CM | POA: Diagnosis not present

## 2015-03-13 DIAGNOSIS — E780 Pure hypercholesterolemia: Secondary | ICD-10-CM | POA: Diagnosis not present

## 2015-03-13 DIAGNOSIS — R634 Abnormal weight loss: Secondary | ICD-10-CM | POA: Diagnosis not present

## 2015-03-13 DIAGNOSIS — E119 Type 2 diabetes mellitus without complications: Secondary | ICD-10-CM | POA: Diagnosis not present

## 2015-03-13 DIAGNOSIS — R718 Other abnormality of red blood cells: Secondary | ICD-10-CM | POA: Diagnosis not present

## 2015-03-13 DIAGNOSIS — R358 Other polyuria: Secondary | ICD-10-CM | POA: Diagnosis not present

## 2015-03-13 DIAGNOSIS — Z79899 Other long term (current) drug therapy: Secondary | ICD-10-CM | POA: Diagnosis not present

## 2015-03-13 DIAGNOSIS — E1140 Type 2 diabetes mellitus with diabetic neuropathy, unspecified: Secondary | ICD-10-CM | POA: Diagnosis not present

## 2015-03-13 DIAGNOSIS — J4520 Mild intermittent asthma, uncomplicated: Secondary | ICD-10-CM | POA: Diagnosis not present

## 2015-03-27 DIAGNOSIS — R5383 Other fatigue: Secondary | ICD-10-CM | POA: Diagnosis not present

## 2015-03-27 DIAGNOSIS — J4520 Mild intermittent asthma, uncomplicated: Secondary | ICD-10-CM | POA: Diagnosis not present

## 2015-03-28 DIAGNOSIS — D649 Anemia, unspecified: Secondary | ICD-10-CM | POA: Diagnosis not present

## 2015-04-03 DIAGNOSIS — D649 Anemia, unspecified: Secondary | ICD-10-CM | POA: Diagnosis not present

## 2015-04-08 ENCOUNTER — Other Ambulatory Visit (HOSPITAL_COMMUNITY)
Admission: RE | Admit: 2015-04-08 | Disposition: A | Discharge status: Home / Self Care | Source: Ambulatory Visit | Attending: Oncology | Admitting: Oncology

## 2015-04-08 DIAGNOSIS — D649 Anemia, unspecified: Secondary | ICD-10-CM | POA: Diagnosis not present

## 2015-04-08 DIAGNOSIS — E786 Lipoprotein deficiency: Secondary | ICD-10-CM | POA: Diagnosis not present

## 2015-04-08 LAB — BONE MARROW EXAM

## 2015-04-16 LAB — CHROMOSOME ANALYSIS, BONE MARROW

## 2015-04-18 DIAGNOSIS — D649 Anemia, unspecified: Secondary | ICD-10-CM | POA: Diagnosis not present

## 2015-07-24 DIAGNOSIS — D649 Anemia, unspecified: Secondary | ICD-10-CM | POA: Diagnosis not present

## 2015-08-06 DIAGNOSIS — R009 Unspecified abnormalities of heart beat: Secondary | ICD-10-CM | POA: Diagnosis not present

## 2015-08-06 DIAGNOSIS — E782 Mixed hyperlipidemia: Secondary | ICD-10-CM | POA: Diagnosis not present

## 2015-08-06 DIAGNOSIS — D6489 Other specified anemias: Secondary | ICD-10-CM | POA: Diagnosis not present

## 2015-08-06 DIAGNOSIS — E1321 Other specified diabetes mellitus with diabetic nephropathy: Secondary | ICD-10-CM | POA: Diagnosis not present

## 2015-08-06 DIAGNOSIS — E784 Other hyperlipidemia: Secondary | ICD-10-CM | POA: Diagnosis not present

## 2015-08-06 DIAGNOSIS — E1140 Type 2 diabetes mellitus with diabetic neuropathy, unspecified: Secondary | ICD-10-CM | POA: Diagnosis not present

## 2015-08-06 DIAGNOSIS — Z23 Encounter for immunization: Secondary | ICD-10-CM | POA: Diagnosis not present

## 2015-08-06 DIAGNOSIS — F5101 Primary insomnia: Secondary | ICD-10-CM | POA: Diagnosis not present

## 2015-08-06 DIAGNOSIS — R0789 Other chest pain: Secondary | ICD-10-CM | POA: Diagnosis not present

## 2015-08-06 DIAGNOSIS — E1142 Type 2 diabetes mellitus with diabetic polyneuropathy: Secondary | ICD-10-CM | POA: Diagnosis not present

## 2015-08-06 DIAGNOSIS — M545 Low back pain: Secondary | ICD-10-CM | POA: Diagnosis not present

## 2015-08-11 ENCOUNTER — Encounter (HOSPITAL_COMMUNITY)

## 2015-08-11 ENCOUNTER — Ambulatory Visit: Admitting: Family

## 2015-09-05 ENCOUNTER — Encounter: Payer: Self-pay | Admitting: Family

## 2015-09-12 ENCOUNTER — Ambulatory Visit: Admitting: Family

## 2015-09-12 ENCOUNTER — Ambulatory Visit (HOSPITAL_COMMUNITY)
Admission: RE | Admit: 2015-09-12 | Discharge: 2015-09-12 | Disposition: A | Discharge status: Home / Self Care | Payer: Medicare Other | Source: Ambulatory Visit | Attending: Family | Admitting: Family

## 2015-09-12 ENCOUNTER — Ambulatory Visit (INDEPENDENT_AMBULATORY_CARE_PROVIDER_SITE_OTHER): Payer: Medicare Other | Admitting: Family

## 2015-09-12 ENCOUNTER — Encounter: Payer: Self-pay | Admitting: Family

## 2015-09-12 VITALS — BP 132/56 | HR 60 | Temp 97.2°F | Resp 16 | Ht 61.5 in | Wt 109.0 lb

## 2015-09-12 DIAGNOSIS — E119 Type 2 diabetes mellitus without complications: Secondary | ICD-10-CM | POA: Insufficient documentation

## 2015-09-12 DIAGNOSIS — I200 Unstable angina: Secondary | ICD-10-CM | POA: Diagnosis not present

## 2015-09-12 DIAGNOSIS — I4891 Unspecified atrial fibrillation: Secondary | ICD-10-CM | POA: Diagnosis not present

## 2015-09-12 DIAGNOSIS — I6523 Occlusion and stenosis of bilateral carotid arteries: Secondary | ICD-10-CM

## 2015-09-12 DIAGNOSIS — E785 Hyperlipidemia, unspecified: Secondary | ICD-10-CM | POA: Diagnosis not present

## 2015-09-12 DIAGNOSIS — M542 Cervicalgia: Secondary | ICD-10-CM | POA: Diagnosis not present

## 2015-09-12 DIAGNOSIS — Z79899 Other long term (current) drug therapy: Secondary | ICD-10-CM | POA: Diagnosis not present

## 2015-09-12 DIAGNOSIS — R0602 Shortness of breath: Secondary | ICD-10-CM | POA: Diagnosis not present

## 2015-09-12 DIAGNOSIS — R072 Precordial pain: Secondary | ICD-10-CM | POA: Diagnosis not present

## 2015-09-12 DIAGNOSIS — Z48812 Encounter for surgical aftercare following surgery on the circulatory system: Secondary | ICD-10-CM

## 2015-09-12 DIAGNOSIS — E786 Lipoprotein deficiency: Secondary | ICD-10-CM | POA: Diagnosis not present

## 2015-09-12 DIAGNOSIS — I779 Disorder of arteries and arterioles, unspecified: Secondary | ICD-10-CM | POA: Insufficient documentation

## 2015-09-12 DIAGNOSIS — I10 Essential (primary) hypertension: Secondary | ICD-10-CM | POA: Diagnosis not present

## 2015-09-12 DIAGNOSIS — J449 Chronic obstructive pulmonary disease, unspecified: Secondary | ICD-10-CM | POA: Diagnosis not present

## 2015-09-12 DIAGNOSIS — Z9889 Other specified postprocedural states: Secondary | ICD-10-CM

## 2015-09-12 DIAGNOSIS — Z7984 Long term (current) use of oral hypoglycemic drugs: Secondary | ICD-10-CM | POA: Diagnosis not present

## 2015-09-12 DIAGNOSIS — R079 Chest pain, unspecified: Secondary | ICD-10-CM | POA: Diagnosis not present

## 2015-09-12 DIAGNOSIS — K219 Gastro-esophageal reflux disease without esophagitis: Secondary | ICD-10-CM | POA: Diagnosis not present

## 2015-09-12 DIAGNOSIS — J45909 Unspecified asthma, uncomplicated: Secondary | ICD-10-CM | POA: Diagnosis not present

## 2015-09-12 DIAGNOSIS — Z8673 Personal history of transient ischemic attack (TIA), and cerebral infarction without residual deficits: Secondary | ICD-10-CM | POA: Diagnosis not present

## 2015-09-12 DIAGNOSIS — Z7902 Long term (current) use of antithrombotics/antiplatelets: Secondary | ICD-10-CM | POA: Diagnosis not present

## 2015-09-12 NOTE — Progress Notes (Signed)
Chief Complaint: Extracranial Carotid Artery Stenosis   History of Present Illness  [name] is a 74 y.o. female patient of Dr. Trula Slade who is status post left CEA in 2009. She returns today for follow up.  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 All strokes manifested as right side weakness, denies any subsequent strokes or TIA's.  Has residual right arm and leg weakness and has fallen. Has trouble with her asthma. Patient denies heart problems, denies claudication symptoms, denies non healing wounds.  She denies post prandial abdominal pain. She states that she is physically active  The patient denies amaurosis fugax or monocular blindness.  The patient reports mild continued expressive aphasia.   Patient reports New Medical or Surgical History: had a colonoscopy about Sept., 2015, states she does not know results  Pt reports intermittent left side chest pain for the last 2 months, relieved by her husband's SL NTG.  Pt Diabetic: Yes, states in good control Pt smoker: former smoker, quit about 2000  Pt meds include: Statin : Yes ASA: 81 mg daily Other anticoagulants/antiplatelets: Plavix     Past Medical History  Diagnosis Date  . Hyperlipidemia   . Stroke ([place])   . GERD (gastroesophageal reflux disease)   . Arthritis   . Asthma   . Leg pain   . Anxiety   . Anemia   . Carotid artery occlusion   . Diabetes mellitus 59  . Varicose veins     Social History Social History  Substance Use Topics  . Smoking status: Former Smoker    Types: Cigarettes    Quit date: 09/20/1996  . Smokeless tobacco: Never Used  . Alcohol Use: No    Family History Family History  Problem Relation Age of Onset  . Stroke Other   . Heart disease Other   . Cancer Father     Colon  . Diabetes Daughter   . Hyperlipidemia Daughter   . Varicose Veins Daughter     Surgical History Past Surgical History  Procedure Laterality Date  . Carotid endarterectomy   07/26/2008    left  . Cholecystectomy      No Known Allergies  Current Outpatient Prescriptions  Medication Sig Dispense Refill  . albuterol (PROVENTIL HFA;VENTOLIN HFA) 108 (90 BASE) MCG/ACT inhaler Inhale 2 puffs into the lungs every 6 (six) hours as needed.      Marland Kitchen aspirin EC 81 MG tablet Take 81 mg by mouth daily.      Marland Kitchen atorvastatin (LIPITOR) 80 MG tablet Take 80 mg by mouth daily.      . cloNIDine (CATAPRES) 0.1 MG tablet Take 0.1 mg by mouth 2 (two) times daily.    . clopidogrel (PLAVIX) 75 MG tablet Take 75 mg by mouth daily.      . cyclobenzaprine (FLEXERIL) 10 MG tablet Take 10 mg by mouth 3 (three) times daily as needed.      Marland Kitchen esomeprazole (NEXIUM) 40 MG capsule Take 40 mg by mouth daily before breakfast.      . fenofibrate micronized (LOFIBRA) 200 MG capsule Take 200 mg by mouth daily before breakfast.      . Fluticasone Furoate-Vilanterol 100-25 MCG/INH AEPB Inhale into the lungs.    Marland Kitchen glipiZIDE (GLUCOTROL) 10 MG tablet Take 10 mg by mouth daily.      Marland Kitchen HYDROcodone-acetaminophen (NORCO/VICODIN) 5-325 MG per tablet Take 1 tablet by mouth as needed for moderate pain. Hip and Shoulder pain    . metFORMIN (GLUMETZA)  500 MG (MOD) 24 hr tablet Take 500 mg by mouth daily with breakfast.      . promethazine (PHENERGAN) 25 MG tablet Take 25 mg by mouth every 6 (six) hours as needed for nausea or vomiting.    . sertraline (ZOLOFT) 100 MG tablet Take 100 mg by mouth daily.    Marland Kitchen zolpidem (AMBIEN) 10 MG tablet Take 10 mg by mouth at bedtime as needed.       No current facility-administered medications for this visit.    Review of Systems : See HPI for pertinent positives and negatives.  Physical Examination  Filed Vitals:   09/12/15 1057 09/12/15 1102  BP: 132/65 132/56  Pulse: 57 60  Temp:  97.2 F (36.2 C)  TempSrc:  Oral  Resp:  16  Height:  5' 1.5" (1.562 m)  Weight:  109 lb (49.442 kg)  SpO2:  100%   Body mass index is 20.26 kg/(m^2).  General: WDWN female in  NAD GAIT: normal Eyes: PERRLA Pulmonary: CTAB, no rales,  rhonchi, or wheezing.  Cardiac: regular rhythm, no detected murmur.  VASCULAR EXAM Carotid Bruits Left Right   Negative Negative   . Radial pulses are 2+ palpable and equal.      LE Pulses LEFT RIGHT       FEMORAL 2+ palpable 2+ palpable   POPLITEAL not palpable  not palpable   POSTERIOR TIBIAL  not palpable  not  palpable    DORSALIS PEDIS  ANTERIOR TIBIAL 1+ palpable  1+ palpable     Gastrointestinal: soft, nontender, BS WNL, no r/g, no palpable masses.  Musculoskeletal: No muscle atrophy/wasting. M/S 4/5 throughout, Extremities without ischemic changes.  Neurologic: A&O X 3; Appropriate Affect ; SENSATION ;normal;  Speech is normal CN 2-12 intact, Pain and light touch intact in extremities, Motor exam as listed above.         Non-Invasive Vascular Imaging CAROTID DUPLEX 09/12/2015   Right ICA: <40% stenosis. Left ICA: widely patent CEA site with no restenosis. No significant change compared to prior exam.   Assessment: [name] is a 74 y.o. female who is status post left CEA in 2009. Pt had 2 CVA's before the CEA and another in 2013. All strokes manifested as right side weakness, denies any subsequent strokes or TIA's. Today's carotid duplex suggests minimal right ICA stenosis and widely patent left CEA site with no restenosis.  No significant change compared to prior exam.  I spoke with Dr. Roda Shutters on pt's son cell phone; pt will see Dr. Tobie Poet at 1:30 today to evaluate left side chest pain relieved by NTG; pt denies accompanying dyspnea with chest pain, denies nausea or diaphoresis with the chest pain.   Plan: Follow-up in 1 year with Carotid Duplex scan.   I discussed in depth with the patient the  nature of atherosclerosis, and emphasized the importance of maximal medical management including strict control of blood pressure, blood glucose, and lipid levels, obtaining regular exercise, and continued cessation of smoking.  The patient is aware that without maximal medical management the underlying atherosclerotic disease process will progress, limiting the benefit of any interventions. The patient was given information about stroke prevention and what symptoms should prompt the patient to seek immediate medical care. Thank you for allowing [place] to participate in this patient's care.  Clemon Chambers, RN, MSN, FNP-C [facility] [place] Office: [phone]  Clinic Physician: Early  09/12/2015 11:16 AM

## 2015-09-12 NOTE — Patient Instructions (Signed)
Stroke Prevention Some medical conditions and behaviors are associated with an increased chance of having a stroke. You may prevent a stroke by making healthy choices and managing medical conditions. HOW CAN I REDUCE MY RISK OF HAVING A STROKE?   Stay physically active. Get at least 30 minutes of activity on most or all days.  Do not smoke. It may also be helpful to avoid exposure to secondhand smoke.  Limit alcohol use. Moderate alcohol use is considered to be:  No more than 2 drinks per day for men.  No more than 1 drink per day for nonpregnant women.  Eat healthy foods. This involves:  Eating 5 or more servings of fruits and vegetables a day.  Making dietary changes that address high blood pressure (hypertension), high cholesterol, diabetes, or obesity.  Manage your cholesterol levels.  Making food choices that are high in fiber and low in saturated fat, trans fat, and cholesterol may control cholesterol levels.  Take any prescribed medicines to control cholesterol as directed by your health care provider.  Manage your diabetes.  Controlling your carbohydrate and sugar intake is recommended to manage diabetes.  Take any prescribed medicines to control diabetes as directed by your health care provider.  Control your hypertension.  Making food choices that are low in salt (sodium), saturated fat, trans fat, and cholesterol is recommended to manage hypertension.  Ask your health care provider if you need treatment to lower your blood pressure. Take any prescribed medicines to control hypertension as directed by your health care provider.  If you are 18-39 years of age, have your blood pressure checked every 3-5 years. If you are 40 years of age or older, have your blood pressure checked every year.  Maintain a healthy weight.  Reducing calorie intake and making food choices that are low in sodium, saturated fat, trans fat, and cholesterol are recommended to manage  weight.  Stop drug abuse.  Avoid taking birth control pills.  Talk to your health care provider about the risks of taking birth control pills if you are over 35 years old, smoke, get migraines, or have ever had a blood clot.  Get evaluated for sleep disorders (sleep apnea).  Talk to your health care provider about getting a sleep evaluation if you snore a lot or have excessive sleepiness.  Take medicines only as directed by your health care provider.  For some people, aspirin or blood thinners (anticoagulants) are helpful in reducing the risk of forming abnormal blood clots that can lead to stroke. If you have the irregular heart rhythm of atrial fibrillation, you should be on a blood thinner unless there is a good reason you cannot take them.  Understand all your medicine instructions.  Make sure that other conditions (such as anemia or atherosclerosis) are addressed. SEEK IMMEDIATE MEDICAL CARE IF:   You have sudden weakness or numbness of the face, arm, or leg, especially on one side of the body.  Your face or eyelid droops to one side.  You have sudden confusion.  You have trouble speaking (aphasia) or understanding.  You have sudden trouble seeing in one or both eyes.  You have sudden trouble walking.  You have dizziness.  You have a loss of balance or coordination.  You have a sudden, severe headache with no known cause.  You have new chest pain or an irregular heartbeat. Any of these symptoms may represent a serious problem that is an emergency. Do not wait to see if the symptoms will   go away. Get medical help at once. Call your local emergency services (911 in [place] Do not drive yourself to the hospital.   This information is not intended to replace advice given to you by your health care provider. Make sure you discuss any questions you have with your health care provider.   Document Released: 10/14/2004 Document Revised: 09/27/2014 Document Reviewed:  03/09/2013 Elsevier Interactive Patient Education 2016 [employer]  

## 2015-09-12 NOTE — Addendum Note (Signed)
Addended by: Dorthula Rue L on: 09/12/2015 02:27 PM   Modules accepted: Orders

## 2015-09-13 DIAGNOSIS — R079 Chest pain, unspecified: Secondary | ICD-10-CM | POA: Diagnosis not present

## 2015-09-17 DIAGNOSIS — F320 Major depressive disorder, single episode, mild: Secondary | ICD-10-CM | POA: Diagnosis not present

## 2015-09-17 DIAGNOSIS — F418 Other specified anxiety disorders: Secondary | ICD-10-CM | POA: Diagnosis not present

## 2015-09-17 DIAGNOSIS — R0789 Other chest pain: Secondary | ICD-10-CM | POA: Diagnosis not present

## 2015-10-09 DIAGNOSIS — D485 Neoplasm of uncertain behavior of skin: Secondary | ICD-10-CM | POA: Diagnosis not present

## 2015-10-16 DIAGNOSIS — L578 Other skin changes due to chronic exposure to nonionizing radiation: Secondary | ICD-10-CM | POA: Diagnosis not present

## 2015-10-16 DIAGNOSIS — D2239 Melanocytic nevi of other parts of face: Secondary | ICD-10-CM | POA: Diagnosis not present

## 2015-12-22 DIAGNOSIS — E1142 Type 2 diabetes mellitus with diabetic polyneuropathy: Secondary | ICD-10-CM | POA: Diagnosis not present

## 2015-12-22 DIAGNOSIS — J4520 Mild intermittent asthma, uncomplicated: Secondary | ICD-10-CM | POA: Diagnosis not present

## 2015-12-22 DIAGNOSIS — K219 Gastro-esophageal reflux disease without esophagitis: Secondary | ICD-10-CM | POA: Diagnosis not present

## 2015-12-22 DIAGNOSIS — M545 Low back pain: Secondary | ICD-10-CM | POA: Diagnosis not present

## 2015-12-22 DIAGNOSIS — F320 Major depressive disorder, single episode, mild: Secondary | ICD-10-CM | POA: Diagnosis not present

## 2015-12-22 DIAGNOSIS — I10 Essential (primary) hypertension: Secondary | ICD-10-CM | POA: Diagnosis not present

## 2015-12-22 DIAGNOSIS — E1140 Type 2 diabetes mellitus with diabetic neuropathy, unspecified: Secondary | ICD-10-CM | POA: Diagnosis not present

## 2015-12-22 DIAGNOSIS — E782 Mixed hyperlipidemia: Secondary | ICD-10-CM | POA: Diagnosis not present

## 2015-12-22 DIAGNOSIS — E1321 Other specified diabetes mellitus with diabetic nephropathy: Secondary | ICD-10-CM | POA: Diagnosis not present

## 2015-12-22 DIAGNOSIS — I69854 Hemiplegia and hemiparesis following other cerebrovascular disease affecting left non-dominant side: Secondary | ICD-10-CM | POA: Diagnosis not present

## 2016-03-01 DIAGNOSIS — D649 Anemia, unspecified: Secondary | ICD-10-CM | POA: Diagnosis not present

## 2016-03-25 DIAGNOSIS — I10 Essential (primary) hypertension: Secondary | ICD-10-CM | POA: Diagnosis not present

## 2016-03-25 DIAGNOSIS — F320 Major depressive disorder, single episode, mild: Secondary | ICD-10-CM | POA: Diagnosis not present

## 2016-03-25 DIAGNOSIS — K219 Gastro-esophageal reflux disease without esophagitis: Secondary | ICD-10-CM | POA: Diagnosis not present

## 2016-03-25 DIAGNOSIS — E1142 Type 2 diabetes mellitus with diabetic polyneuropathy: Secondary | ICD-10-CM | POA: Diagnosis not present

## 2016-03-25 DIAGNOSIS — E1321 Other specified diabetes mellitus with diabetic nephropathy: Secondary | ICD-10-CM | POA: Diagnosis not present

## 2016-03-25 DIAGNOSIS — M545 Low back pain: Secondary | ICD-10-CM | POA: Diagnosis not present

## 2016-03-25 DIAGNOSIS — J4520 Mild intermittent asthma, uncomplicated: Secondary | ICD-10-CM | POA: Diagnosis not present

## 2016-03-25 DIAGNOSIS — E119 Type 2 diabetes mellitus without complications: Secondary | ICD-10-CM | POA: Diagnosis not present

## 2016-03-25 DIAGNOSIS — W1849XA Other slipping, tripping and stumbling without falling, initial encounter: Secondary | ICD-10-CM | POA: Diagnosis not present

## 2016-03-25 DIAGNOSIS — E782 Mixed hyperlipidemia: Secondary | ICD-10-CM | POA: Diagnosis not present

## 2016-03-25 DIAGNOSIS — I69854 Hemiplegia and hemiparesis following other cerebrovascular disease affecting left non-dominant side: Secondary | ICD-10-CM | POA: Diagnosis not present

## 2016-03-31 DIAGNOSIS — R2689 Other abnormalities of gait and mobility: Secondary | ICD-10-CM | POA: Diagnosis not present

## 2016-04-02 DIAGNOSIS — R2689 Other abnormalities of gait and mobility: Secondary | ICD-10-CM | POA: Diagnosis not present

## 2016-04-07 DIAGNOSIS — R2689 Other abnormalities of gait and mobility: Secondary | ICD-10-CM | POA: Diagnosis not present

## 2016-04-08 DIAGNOSIS — R2689 Other abnormalities of gait and mobility: Secondary | ICD-10-CM | POA: Diagnosis not present

## 2016-04-13 DIAGNOSIS — R2689 Other abnormalities of gait and mobility: Secondary | ICD-10-CM | POA: Diagnosis not present

## 2016-04-16 DIAGNOSIS — R2689 Other abnormalities of gait and mobility: Secondary | ICD-10-CM | POA: Diagnosis not present

## 2016-04-21 DIAGNOSIS — R2689 Other abnormalities of gait and mobility: Secondary | ICD-10-CM | POA: Diagnosis not present

## 2016-04-30 DIAGNOSIS — R2689 Other abnormalities of gait and mobility: Secondary | ICD-10-CM | POA: Diagnosis not present

## 2016-05-04 DIAGNOSIS — R2689 Other abnormalities of gait and mobility: Secondary | ICD-10-CM | POA: Diagnosis not present

## 2016-05-05 DIAGNOSIS — R2689 Other abnormalities of gait and mobility: Secondary | ICD-10-CM | POA: Diagnosis not present

## 2016-05-12 DIAGNOSIS — R2689 Other abnormalities of gait and mobility: Secondary | ICD-10-CM | POA: Diagnosis not present

## 2016-05-13 DIAGNOSIS — R51 Headache: Secondary | ICD-10-CM | POA: Diagnosis not present

## 2016-05-13 DIAGNOSIS — M545 Low back pain: Secondary | ICD-10-CM | POA: Diagnosis not present

## 2016-05-13 DIAGNOSIS — H538 Other visual disturbances: Secondary | ICD-10-CM | POA: Diagnosis not present

## 2016-05-14 DIAGNOSIS — M545 Low back pain: Secondary | ICD-10-CM | POA: Diagnosis not present

## 2016-05-14 DIAGNOSIS — H538 Other visual disturbances: Secondary | ICD-10-CM | POA: Diagnosis not present

## 2016-05-14 DIAGNOSIS — M47816 Spondylosis without myelopathy or radiculopathy, lumbar region: Secondary | ICD-10-CM | POA: Diagnosis not present

## 2016-05-14 DIAGNOSIS — R51 Headache: Secondary | ICD-10-CM | POA: Diagnosis not present

## 2016-05-27 DIAGNOSIS — R531 Weakness: Secondary | ICD-10-CM | POA: Diagnosis not present

## 2016-05-27 DIAGNOSIS — R0602 Shortness of breath: Secondary | ICD-10-CM | POA: Diagnosis not present

## 2016-05-27 DIAGNOSIS — R1084 Generalized abdominal pain: Secondary | ICD-10-CM | POA: Diagnosis not present

## 2016-05-27 DIAGNOSIS — K2900 Acute gastritis without bleeding: Secondary | ICD-10-CM | POA: Diagnosis not present

## 2016-05-27 DIAGNOSIS — R109 Unspecified abdominal pain: Secondary | ICD-10-CM | POA: Diagnosis not present

## 2016-06-06 ENCOUNTER — Emergency Department (HOSPITAL_COMMUNITY): Payer: Medicare Other

## 2016-06-06 ENCOUNTER — Observation Stay (HOSPITAL_COMMUNITY)
Admission: EM | Admit: 2016-06-06 | Discharge: 2016-06-07 | Disposition: A | Discharge status: Home / Self Care | Payer: Medicare Other | Attending: Internal Medicine | Admitting: Internal Medicine

## 2016-06-06 ENCOUNTER — Encounter (HOSPITAL_COMMUNITY): Payer: Self-pay

## 2016-06-06 DIAGNOSIS — T426X5A Adverse effect of other antiepileptic and sedative-hypnotic drugs, initial encounter: Secondary | ICD-10-CM | POA: Diagnosis not present

## 2016-06-06 DIAGNOSIS — G8929 Other chronic pain: Secondary | ICD-10-CM | POA: Insufficient documentation

## 2016-06-06 DIAGNOSIS — G92 Toxic encephalopathy: Secondary | ICD-10-CM | POA: Diagnosis not present

## 2016-06-06 DIAGNOSIS — E119 Type 2 diabetes mellitus without complications: Secondary | ICD-10-CM | POA: Diagnosis not present

## 2016-06-06 DIAGNOSIS — T481X5A Adverse effect of skeletal muscle relaxants [neuromuscular blocking agents], initial encounter: Secondary | ICD-10-CM | POA: Diagnosis not present

## 2016-06-06 DIAGNOSIS — F329 Major depressive disorder, single episode, unspecified: Secondary | ICD-10-CM

## 2016-06-06 DIAGNOSIS — Z8249 Family history of ischemic heart disease and other diseases of the circulatory system: Secondary | ICD-10-CM | POA: Insufficient documentation

## 2016-06-06 DIAGNOSIS — I6523 Occlusion and stenosis of bilateral carotid arteries: Secondary | ICD-10-CM | POA: Insufficient documentation

## 2016-06-06 DIAGNOSIS — I10 Essential (primary) hypertension: Secondary | ICD-10-CM | POA: Diagnosis not present

## 2016-06-06 DIAGNOSIS — G4700 Insomnia, unspecified: Secondary | ICD-10-CM | POA: Diagnosis not present

## 2016-06-06 DIAGNOSIS — Z7902 Long term (current) use of antithrombotics/antiplatelets: Secondary | ICD-10-CM | POA: Diagnosis not present

## 2016-06-06 DIAGNOSIS — Z7982 Long term (current) use of aspirin: Secondary | ICD-10-CM | POA: Insufficient documentation

## 2016-06-06 DIAGNOSIS — K219 Gastro-esophageal reflux disease without esophagitis: Secondary | ICD-10-CM | POA: Diagnosis not present

## 2016-06-06 DIAGNOSIS — E785 Hyperlipidemia, unspecified: Secondary | ICD-10-CM | POA: Insufficient documentation

## 2016-06-06 DIAGNOSIS — M1990 Unspecified osteoarthritis, unspecified site: Secondary | ICD-10-CM | POA: Insufficient documentation

## 2016-06-06 DIAGNOSIS — F419 Anxiety disorder, unspecified: Secondary | ICD-10-CM | POA: Diagnosis not present

## 2016-06-06 DIAGNOSIS — T402X5A Adverse effect of other opioids, initial encounter: Secondary | ICD-10-CM | POA: Insufficient documentation

## 2016-06-06 DIAGNOSIS — E7220 Disorder of urea cycle metabolism, unspecified: Secondary | ICD-10-CM | POA: Insufficient documentation

## 2016-06-06 DIAGNOSIS — Z8673 Personal history of transient ischemic attack (TIA), and cerebral infarction without residual deficits: Secondary | ICD-10-CM | POA: Insufficient documentation

## 2016-06-06 DIAGNOSIS — Z823 Family history of stroke: Secondary | ICD-10-CM | POA: Insufficient documentation

## 2016-06-06 DIAGNOSIS — M549 Dorsalgia, unspecified: Secondary | ICD-10-CM | POA: Insufficient documentation

## 2016-06-06 DIAGNOSIS — D649 Anemia, unspecified: Secondary | ICD-10-CM | POA: Diagnosis not present

## 2016-06-06 DIAGNOSIS — G9340 Encephalopathy, unspecified: Secondary | ICD-10-CM | POA: Diagnosis not present

## 2016-06-06 DIAGNOSIS — I672 Cerebral atherosclerosis: Secondary | ICD-10-CM | POA: Diagnosis not present

## 2016-06-06 DIAGNOSIS — I447 Left bundle-branch block, unspecified: Secondary | ICD-10-CM | POA: Insufficient documentation

## 2016-06-06 DIAGNOSIS — Z800 Family history of malignant neoplasm of digestive organs: Secondary | ICD-10-CM | POA: Insufficient documentation

## 2016-06-06 DIAGNOSIS — F0390 Unspecified dementia without behavioral disturbance: Secondary | ICD-10-CM | POA: Diagnosis not present

## 2016-06-06 DIAGNOSIS — Z87891 Personal history of nicotine dependence: Secondary | ICD-10-CM | POA: Insufficient documentation

## 2016-06-06 DIAGNOSIS — J449 Chronic obstructive pulmonary disease, unspecified: Secondary | ICD-10-CM | POA: Diagnosis not present

## 2016-06-06 DIAGNOSIS — Z7984 Long term (current) use of oral hypoglycemic drugs: Secondary | ICD-10-CM | POA: Insufficient documentation

## 2016-06-06 DIAGNOSIS — I6930 Unspecified sequelae of cerebral infarction: Secondary | ICD-10-CM

## 2016-06-06 DIAGNOSIS — F32A Depression, unspecified: Secondary | ICD-10-CM | POA: Diagnosis present

## 2016-06-06 DIAGNOSIS — Z833 Family history of diabetes mellitus: Secondary | ICD-10-CM | POA: Insufficient documentation

## 2016-06-06 DIAGNOSIS — R404 Transient alteration of awareness: Secondary | ICD-10-CM | POA: Diagnosis not present

## 2016-06-06 DIAGNOSIS — S0990XA Unspecified injury of head, initial encounter: Secondary | ICD-10-CM | POA: Diagnosis not present

## 2016-06-06 DIAGNOSIS — I6529 Occlusion and stenosis of unspecified carotid artery: Secondary | ICD-10-CM | POA: Diagnosis present

## 2016-06-06 DIAGNOSIS — Z8349 Family history of other endocrine, nutritional and metabolic diseases: Secondary | ICD-10-CM | POA: Insufficient documentation

## 2016-06-06 DIAGNOSIS — R55 Syncope and collapse: Secondary | ICD-10-CM | POA: Diagnosis not present

## 2016-06-06 DIAGNOSIS — Z9049 Acquired absence of other specified parts of digestive tract: Secondary | ICD-10-CM | POA: Insufficient documentation

## 2016-06-06 LAB — ETHANOL

## 2016-06-06 LAB — URINALYSIS, ROUTINE W REFLEX MICROSCOPIC
Bilirubin Urine: NEGATIVE
Glucose, UA: NEGATIVE mg/dL
Hgb urine dipstick: NEGATIVE
Ketones, ur: NEGATIVE mg/dL
Nitrite: NEGATIVE
Protein, ur: NEGATIVE mg/dL
Specific Gravity, Urine: 1.007 (ref 1.005–1.030)
pH: 6 (ref 5.0–8.0)

## 2016-06-06 LAB — COMPREHENSIVE METABOLIC PANEL WITH GFR
ALT: 11 U/L — ABNORMAL LOW (ref 14–54)
AST: 28 U/L (ref 15–41)
Albumin: 4.8 g/dL (ref 3.5–5.0)
Alkaline Phosphatase: 42 U/L (ref 38–126)
Anion gap: 8 (ref 5–15)
BUN: 13 mg/dL (ref 6–20)
CO2: 24 mmol/L (ref 22–32)
Calcium: 9.7 mg/dL (ref 8.9–10.3)
Chloride: 106 mmol/L (ref 101–111)
Creatinine, Ser: 0.95 mg/dL (ref 0.44–1.00)
GFR calc Af Amer: >60 mL/min
GFR calc non Af Amer: 57 mL/min — ABNORMAL LOW
Glucose, Bld: 87 mg/dL (ref 65–99)
Potassium: 3.5 mmol/L (ref 3.5–5.1)
Sodium: 138 mmol/L (ref 135–145)
Total Bilirubin: 0.6 mg/dL (ref 0.3–1.2)
Total Protein: 6.9 g/dL (ref 6.5–8.1)

## 2016-06-06 LAB — CBC WITH DIFFERENTIAL/PLATELET
BASOS PCT: 1 %
Basophils Absolute: 0.1 10*3/uL (ref 0.0–0.1)
EOS PCT: 2 %
Eosinophils Absolute: 0.1 10*3/uL (ref 0.0–0.7)
HCT: 30.8 % — ABNORMAL LOW (ref 36.0–46.0)
Hemoglobin: 9.8 g/dL — ABNORMAL LOW (ref 12.0–15.0)
LYMPHS ABS: 1.8 10*3/uL (ref 0.7–4.0)
Lymphocytes Relative: 26 %
MCH: 30.1 pg (ref 26.0–34.0)
MCHC: 31.8 g/dL (ref 30.0–36.0)
MCV: 94.5 fL (ref 78.0–100.0)
MONO ABS: 0.6 10*3/uL (ref 0.1–1.0)
MONOS PCT: 9 %
NEUTROS PCT: 62 %
Neutro Abs: 4.3 10*3/uL (ref 1.7–7.7)
Platelets: 354 10*3/uL (ref 150–400)
RBC: 3.26 MIL/uL — ABNORMAL LOW (ref 3.87–5.11)
RDW: 13.1 % (ref 11.5–15.5)
WBC: 6.9 10*3/uL (ref 4.0–10.5)

## 2016-06-06 LAB — URINE MICROSCOPIC-ADD ON

## 2016-06-06 LAB — PROTIME-INR
INR: 0.97
Prothrombin Time: 12.9 s (ref 11.4–15.2)

## 2016-06-06 LAB — I-STAT CHEM 8, ED
BUN: 15 mg/dL (ref 6–20)
Calcium, Ion: 1.24 mmol/L (ref 1.15–1.40)
Chloride: 104 mmol/L (ref 101–111)
Creatinine, Ser: 0.9 mg/dL (ref 0.44–1.00)
Glucose, Bld: 83 mg/dL (ref 65–99)
HCT: 33 % — ABNORMAL LOW (ref 36.0–46.0)
Hemoglobin: 11.2 g/dL — ABNORMAL LOW (ref 12.0–15.0)
Potassium: 3.6 mmol/L (ref 3.5–5.1)
Sodium: 141 mmol/L (ref 135–145)
TCO2: 25 mmol/L (ref 0–100)

## 2016-06-06 LAB — I-STAT TROPONIN, ED: Troponin i, poc: 0 ng/mL (ref 0.00–0.08)

## 2016-06-06 LAB — RAPID URINE DRUG SCREEN, HOSP PERFORMED
AMPHETAMINES: NOT DETECTED
BARBITURATES: NOT DETECTED
Benzodiazepines: NOT DETECTED
Cocaine: NOT DETECTED
Opiates: NOT DETECTED
TETRAHYDROCANNABINOL: NOT DETECTED

## 2016-06-06 LAB — CBG MONITORING, ED: GLUCOSE-CAPILLARY: 80 mg/dL (ref 65–99)

## 2016-06-06 LAB — AMMONIA: AMMONIA: 36 umol/L — AB (ref 9–35)

## 2016-06-06 LAB — APTT: aPTT: 25 s (ref 24–36)

## 2016-06-06 LAB — TSH: TSH: 2.588 u[IU]/mL (ref 0.350–4.500)

## 2016-06-06 MED ORDER — SODIUM CHLORIDE 0.9 % IV BOLUS (SEPSIS): 1000.0000 mL | Freq: Once | INTRAVENOUS | Status: DC

## 2016-06-06 MED ORDER — ASPIRIN EC 81 MG PO TBEC
81.0000 mg | DELAYED_RELEASE_TABLET | Freq: Every day | ORAL | Status: DC
  Administered 2016-06-07: 81 mg via ORAL
  Filled 2016-06-06: qty 1

## 2016-06-06 MED ORDER — ONDANSETRON HCL 4 MG PO TABS: 4.0000 mg | ORAL_TABLET | Freq: Four times a day (QID) | ORAL | Status: DC | PRN

## 2016-06-06 MED ORDER — ENOXAPARIN SODIUM 40 MG/0.4ML ~~LOC~~ SOLN
40.0000 mg | Freq: Every day | SUBCUTANEOUS | Status: DC
  Administered 2016-06-06: 40 mg via SUBCUTANEOUS
  Filled 2016-06-06: qty 0.4

## 2016-06-06 MED ORDER — ATORVASTATIN CALCIUM 80 MG PO TABS
80.0000 mg | ORAL_TABLET | Freq: Every day | ORAL | Status: DC
Start: 2016-06-07 — End: 2016-06-07

## 2016-06-06 MED ORDER — CLONIDINE HCL 0.1 MG PO TABS
0.1000 mg | ORAL_TABLET | Freq: Two times a day (BID) | ORAL | Status: DC
  Administered 2016-06-06 – 2016-06-07 (×2): 0.1 mg via ORAL
  Filled 2016-06-06 (×2): qty 1

## 2016-06-06 MED ORDER — POLYETHYLENE GLYCOL 3350 17 G PO PACK: 17.0000 g | PACK | Freq: Every day | ORAL | Status: DC | PRN

## 2016-06-06 MED ORDER — CLOPIDOGREL BISULFATE 75 MG PO TABS
75.0000 mg | ORAL_TABLET | Freq: Every day | ORAL | Status: DC
  Administered 2016-06-07: 75 mg via ORAL
  Filled 2016-06-06: qty 1

## 2016-06-06 MED ORDER — PANTOPRAZOLE SODIUM 40 MG PO TBEC
40.0000 mg | DELAYED_RELEASE_TABLET | Freq: Every day | ORAL | Status: DC
  Administered 2016-06-07: 40 mg via ORAL
  Filled 2016-06-06: qty 1

## 2016-06-06 MED ORDER — SODIUM CHLORIDE 0.9 % IV SOLN: INTRAVENOUS | Status: DC

## 2016-06-06 MED ORDER — SODIUM CHLORIDE 0.9 % IV SOLN
INTRAVENOUS | Status: AC
  Administered 2016-06-06: via INTRAVENOUS

## 2016-06-06 MED ORDER — SODIUM CHLORIDE 0.9% FLUSH
3.0000 mL | Freq: Two times a day (BID) | INTRAVENOUS | Status: DC
  Administered 2016-06-06 – 2016-06-07 (×2): 3 mL via INTRAVENOUS

## 2016-06-06 MED ORDER — INSULIN ASPART 100 UNIT/ML ~~LOC~~ SOLN
0.0000 [IU] | Freq: Three times a day (TID) | SUBCUTANEOUS | Status: DC
  Administered 2016-06-07 (×3): 1 [IU] via SUBCUTANEOUS

## 2016-06-06 MED ORDER — ACETAMINOPHEN 650 MG RE SUPP: 650.0000 mg | Freq: Four times a day (QID) | RECTAL | Status: DC | PRN

## 2016-06-06 MED ORDER — FLUTICASONE FUROATE-VILANTEROL 100-25 MCG/INH IN AEPB
1.0000 | INHALATION_SPRAY | Freq: Every day | RESPIRATORY_TRACT | Status: DC
  Filled 2016-06-06: qty 28

## 2016-06-06 MED ORDER — FENOFIBRATE 160 MG PO TABS
160.0000 mg | ORAL_TABLET | Freq: Every day | ORAL | Status: DC
  Administered 2016-06-07: 160 mg via ORAL
  Filled 2016-06-06: qty 1

## 2016-06-06 MED ORDER — IBUPROFEN 400 MG PO TABS: 400.0000 mg | ORAL_TABLET | Freq: Four times a day (QID) | ORAL | Status: DC | PRN

## 2016-06-06 MED ORDER — ALBUTEROL SULFATE HFA 108 (90 BASE) MCG/ACT IN AERS: 2.0000 | INHALATION_SPRAY | RESPIRATORY_TRACT | Status: DC | PRN

## 2016-06-06 MED ORDER — ONDANSETRON HCL 4 MG/2ML IJ SOLN: 4.0000 mg | Freq: Four times a day (QID) | INTRAMUSCULAR | Status: DC | PRN

## 2016-06-06 MED ORDER — BISACODYL 5 MG PO TBEC: 5.0000 mg | DELAYED_RELEASE_TABLET | Freq: Every day | ORAL | Status: DC | PRN

## 2016-06-06 MED ORDER — ALBUTEROL SULFATE (2.5 MG/3ML) 0.083% IN NEBU: 2.5000 mg | INHALATION_SOLUTION | Freq: Four times a day (QID) | RESPIRATORY_TRACT | Status: DC | PRN

## 2016-06-06 MED ORDER — ACETAMINOPHEN 325 MG PO TABS: 650.0000 mg | ORAL_TABLET | Freq: Four times a day (QID) | ORAL | Status: DC | PRN
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
## 2016-06-06 NOTE — ED Notes (Signed)
Pt. Ambulated to the bathroom, gait steady, stand - by assist given,.  Urine specimen obtained. Urine is clear , yellow
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
## 2016-06-06 NOTE — ED Notes (Signed)
Pt. Is aware of needing an urine specimen 

## 2016-06-06 NOTE — ED Notes (Signed)
Pt. Is unable to urinate at this time.  Pt. Is aware of needing an urine specimen

## 2016-06-06 NOTE — ED Notes (Signed)
Pt Returned from Ct

## 2016-06-06 NOTE — ED Notes (Signed)
Attempted report 
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
## 2016-06-07 ENCOUNTER — Observation Stay (HOSPITAL_COMMUNITY): Payer: Medicare Other

## 2016-06-07 DIAGNOSIS — I10 Essential (primary) hypertension: Secondary | ICD-10-CM | POA: Diagnosis not present

## 2016-06-07 DIAGNOSIS — E119 Type 2 diabetes mellitus without complications: Secondary | ICD-10-CM | POA: Diagnosis not present

## 2016-06-07 DIAGNOSIS — R410 Disorientation, unspecified: Secondary | ICD-10-CM | POA: Diagnosis not present

## 2016-06-07 DIAGNOSIS — G92 Toxic encephalopathy: Secondary | ICD-10-CM | POA: Diagnosis not present

## 2016-06-07 DIAGNOSIS — G9340 Encephalopathy, unspecified: Secondary | ICD-10-CM

## 2016-06-07 DIAGNOSIS — I6523 Occlusion and stenosis of bilateral carotid arteries: Secondary | ICD-10-CM | POA: Diagnosis not present

## 2016-06-07 LAB — GLUCOSE, CAPILLARY
GLUCOSE-CAPILLARY: 122 mg/dL — AB (ref 65–99)
GLUCOSE-CAPILLARY: 123 mg/dL — AB (ref 65–99)
Glucose-Capillary: 129 mg/dL — ABNORMAL HIGH (ref 65–99)
Glucose-Capillary: 98 mg/dL (ref 65–99)

## 2016-06-07 LAB — RPR: RPR: NONREACTIVE

## 2016-06-07 LAB — VITAMIN B12: VITAMIN B 12: 287 pg/mL (ref 180–914)

## 2016-06-07 LAB — BASIC METABOLIC PANEL
ANION GAP: 7 (ref 5–15)
BUN: 10 mg/dL (ref 6–20)
CALCIUM: 9.3 mg/dL (ref 8.9–10.3)
CO2: 24 mmol/L (ref 22–32)
CREATININE: 0.76 mg/dL (ref 0.44–1.00)
Chloride: 111 mmol/L (ref 101–111)
Glucose, Bld: 104 mg/dL — ABNORMAL HIGH (ref 65–99)
Potassium: 3.7 mmol/L (ref 3.5–5.1)
SODIUM: 142 mmol/L (ref 135–145)

## 2016-06-07 LAB — HIV ANTIBODY (ROUTINE TESTING W REFLEX): HIV Screen 4th Generation wRfx: NONREACTIVE

## 2016-06-07 LAB — T4, FREE: Free T4: 0.92 ng/dL (ref 0.61–1.12)

## 2016-06-07 MED ORDER — CYANOCOBALAMIN 250 MCG PO TABS: 250.0000 ug | ORAL_TABLET | Freq: Every day | ORAL | 1 refills | Status: AC
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
## 2016-06-07 NOTE — Care Management Obs Status (Signed)
[facility] NOTIFICATION   Patient Details  Name: [name] MRN: [id number] Date of Birth: 1940/11/18   Medicare Observation Status Notification Given:  Yes    Carles Collet, RN 06/07/2016, 11:35 AM

## 2016-06-07 NOTE — Progress Notes (Signed)
EEG completed, results pending. 

## 2016-06-07 NOTE — Progress Notes (Signed)
Subjective: Patient is in good spirits. But is aware that she has some memory issues. Able to follow commands.  Exam: Vitals:   06/06/16 2231 06/07/16 0601  BP: (!) 124/59 (!) 102/51  Pulse: 73 (!) 57  Resp: 20 17  Temp: 97.6 F (36.4 C) 98.2 F (36.8 C)        Gen: In bed, NAD MS: Patient is alert and oriented to place, year, month and current president but not previous present. She is able to follow simple commands but has some cognitive slowing. CN: 2 through 12 are grossly intact Motor: 5 out of 5 throughout Sensory: Grossly intact   Pertinent Labs/Diagnostics: Ammonia 36 HIV, RPR pending B12 within normal limits TSH 2.58  MRI of the brain showed no acute intracranial abnormalities.  Etta Quill PA-C [facility] [phone]  Impression:  75 year old female with continued altered sensorium. As stated previously she is on multiple psychoactive medications. Patient is obtaining EEG at this moment. As noted previously there is suspect that she may have some underlying dementia however this cannot be diagnosed in the setting and will need further outpatient follow-up for this with neurology. MRI has shown known to cranial abnormalities. Thus far labs have shown no significant etiology.  Recommendations: 1) Will follow EEG. If this is within normal limits along with HIV and RPR, she will need to follow up as outpatient with neurology for a formal neurocognitive exam.    06/07/2016, 10:12 AM

## 2016-06-07 NOTE — Procedures (Signed)
ELECTROENCEPHALOGRAM REPORT  Date of Study: 06/07/2016  Patient's Name: [name] MRN: [id number] Date of Birth: 12/09/1940  Referring Provider: Dr. Roland Rack  Clinical History: This is a 75 year old woman with altered mental status.   Medications: acetaminophen (TYLENOL) tablet 650 mg  albuterol (PROVENTIL) (2.5 MG/3ML) 0.083% nebulizer solution 2.5 mg  aspirin EC tablet 81 mg  atorvastatin (LIPITOR) tablet 80 mg  bisacodyl (DULCOLAX) EC tablet 5 mg  cloNIDine (CATAPRES) tablet 0.1 mg  clopidogrel (PLAVIX) tablet 75 mg  enoxaparin (LOVENOX) injection 40 mg  fenofibrate tablet 160 mg  fluticasone furoate-vilanterol (BREO ELLIPTA) 100-25 MCG/INH 1 puff  ibuprofen (ADVIL,MOTRIN) tablet 400 mg  insulin aspart (novoLOG) injection 0-9 Units   Technical Summary: A multichannel digital EEG recording measured by the international 10-20 system with electrodes applied with paste and impedances below 5000 ohms performed in our laboratory with EKG monitoring in an awake and drowsy patient.  Photic stimulation was performed.  The digital EEG was referentially recorded, reformatted, and digitally filtered in a variety of bipolar and referential montages for optimal display.    Description: The patient is awake and drowsy during the recording.  During maximal wakefulness, there is a symmetric, medium voltage 9 Hz posterior dominant rhythm that attenuates with eye opening.  There is occasional focal 4-5 Hz theta slowing seen over the left temporal region. During drowsiness, there is an increase in theta slowing of the background. Deeper stages of sleep were not seen. Photic stimulation did not elicit any abnormalities.  There were no epileptiform discharges or electrographic seizures seen.    EKG lead showed sinus bradycardia at 54 bpm.  Impression: This awake and asleep EEG is abnormal due to occasional focal slowing over the left temporal  region.  Clinical Correlation of the above  findings indicates focal cerebral dysfunction over the left temporal region suggestive of underlying structural or physiologic abnormality. The absence of epileptiform discharges does not exclude a clinical diagnosis of epilepsy. Clinical correlation is advised.   Ellouise Newer, M.D.

## 2016-06-07 NOTE — Care Management Note (Signed)
Case Management Note  Patient Details  Name: [name] MRN: [id number] Date of Birth: 18-Feb-1941  Subjective/Objective:                 Spoke to patient at the bedside, she is independent from home with husband in [place]. In obs with encephalopathy likely from polypharmacy. Declines HH services and DME. Has available to her RW WC and cane. No CM needs identified at this time. Anticipate DC to home with husband today.    Action/Plan:  DC to home with spouse, no needs.  Expected Discharge Date:                  Expected Discharge Plan:  Home/Self Care  In-House Referral:  NA  Discharge planning Services  CM Consult  Post Acute Care Choice:  NA Choice offered to:  Patient  DME Arranged:  N/A DME Agency:  NA  HH Arranged:  Patient Refused [place] Agency:  NA  Status of Service:  Completed, signed off  If discussed at [place] of Stay Meetings, dates discussed:    Additional Comments:  Carles Collet, RN 06/07/2016, 11:36 AM

## 2016-06-07 NOTE — Progress Notes (Signed)
Pt d/c'ed to home today. Pt to follow up with neurology at [facility]. Discharge instructions and medication information given. All questions answered. IV removed with no complications. Tele d/c'ed. Pt sent home with son. All belongings collected.  Jaymes Graff, RN

## 2016-06-08 LAB — FOLATE RBC
FOLATE, RBC: 1558 ng/mL (ref >498)
Folate, Hemolysate: 437.7 ng/mL
Hematocrit: 28.1 % — ABNORMAL LOW (ref 34.0–46.6)

## 2016-06-08 LAB — HEMOGLOBIN A1C
HEMOGLOBIN A1C: 5.6 % (ref 4.8–5.6)
Mean Plasma Glucose: 114 mg/dL

## 2016-06-28 DIAGNOSIS — I69854 Hemiplegia and hemiparesis following other cerebrovascular disease affecting left non-dominant side: Secondary | ICD-10-CM | POA: Diagnosis not present

## 2016-06-28 DIAGNOSIS — E1321 Other specified diabetes mellitus with diabetic nephropathy: Secondary | ICD-10-CM | POA: Diagnosis not present

## 2016-06-28 DIAGNOSIS — F320 Major depressive disorder, single episode, mild: Secondary | ICD-10-CM | POA: Diagnosis not present

## 2016-06-28 DIAGNOSIS — M545 Low back pain: Secondary | ICD-10-CM | POA: Diagnosis not present

## 2016-06-28 DIAGNOSIS — E782 Mixed hyperlipidemia: Secondary | ICD-10-CM | POA: Diagnosis not present

## 2016-06-28 DIAGNOSIS — Z23 Encounter for immunization: Secondary | ICD-10-CM | POA: Diagnosis not present

## 2016-06-28 DIAGNOSIS — K219 Gastro-esophageal reflux disease without esophagitis: Secondary | ICD-10-CM | POA: Diagnosis not present

## 2016-06-28 DIAGNOSIS — I10 Essential (primary) hypertension: Secondary | ICD-10-CM | POA: Diagnosis not present

## 2016-06-28 DIAGNOSIS — J4520 Mild intermittent asthma, uncomplicated: Secondary | ICD-10-CM | POA: Diagnosis not present

## 2016-06-28 DIAGNOSIS — E1142 Type 2 diabetes mellitus with diabetic polyneuropathy: Secondary | ICD-10-CM | POA: Diagnosis not present

## 2016-09-10 ENCOUNTER — Encounter: Payer: Self-pay | Admitting: Family

## 2016-09-14 ENCOUNTER — Encounter (HOSPITAL_COMMUNITY): Payer: Medicare Other

## 2016-09-14 ENCOUNTER — Ambulatory Visit: Payer: Medicare Other | Admitting: Family

## 2016-09-16 ENCOUNTER — Other Ambulatory Visit: Payer: Self-pay

## 2016-09-16 DIAGNOSIS — I6523 Occlusion and stenosis of bilateral carotid arteries: Secondary | ICD-10-CM

## 2016-09-23 ENCOUNTER — Ambulatory Visit: Payer: Medicare Other | Admitting: Family

## 2016-09-27 ENCOUNTER — Encounter: Payer: Self-pay | Admitting: Family

## 2016-09-29 DIAGNOSIS — E1142 Type 2 diabetes mellitus with diabetic polyneuropathy: Secondary | ICD-10-CM | POA: Diagnosis not present

## 2016-09-29 DIAGNOSIS — E782 Mixed hyperlipidemia: Secondary | ICD-10-CM | POA: Diagnosis not present

## 2016-09-29 DIAGNOSIS — I10 Essential (primary) hypertension: Secondary | ICD-10-CM | POA: Diagnosis not present

## 2016-09-30 DIAGNOSIS — K219 Gastro-esophageal reflux disease without esophagitis: Secondary | ICD-10-CM | POA: Diagnosis not present

## 2016-09-30 DIAGNOSIS — E782 Mixed hyperlipidemia: Secondary | ICD-10-CM | POA: Diagnosis not present

## 2016-09-30 DIAGNOSIS — R42 Dizziness and giddiness: Secondary | ICD-10-CM | POA: Diagnosis not present

## 2016-09-30 DIAGNOSIS — F331 Major depressive disorder, recurrent, moderate: Secondary | ICD-10-CM | POA: Diagnosis not present

## 2016-09-30 DIAGNOSIS — I10 Essential (primary) hypertension: Secondary | ICD-10-CM | POA: Diagnosis not present

## 2016-09-30 DIAGNOSIS — M545 Low back pain: Secondary | ICD-10-CM | POA: Diagnosis not present

## 2016-09-30 DIAGNOSIS — I69854 Hemiplegia and hemiparesis following other cerebrovascular disease affecting left non-dominant side: Secondary | ICD-10-CM | POA: Diagnosis not present

## 2016-09-30 DIAGNOSIS — E1321 Other specified diabetes mellitus with diabetic nephropathy: Secondary | ICD-10-CM | POA: Diagnosis not present

## 2016-09-30 DIAGNOSIS — E1142 Type 2 diabetes mellitus with diabetic polyneuropathy: Secondary | ICD-10-CM | POA: Diagnosis not present

## 2016-09-30 DIAGNOSIS — F5101 Primary insomnia: Secondary | ICD-10-CM | POA: Diagnosis not present

## 2016-09-30 DIAGNOSIS — J4520 Mild intermittent asthma, uncomplicated: Secondary | ICD-10-CM | POA: Diagnosis not present

## 2016-10-01 ENCOUNTER — Ambulatory Visit (HOSPITAL_COMMUNITY)
Admission: RE | Admit: 2016-10-01 | Discharge: 2016-10-01 | Disposition: A | Discharge status: Home / Self Care | Payer: Medicare PPO | Source: Ambulatory Visit | Attending: Family | Admitting: Family

## 2016-10-01 ENCOUNTER — Ambulatory Visit (INDEPENDENT_AMBULATORY_CARE_PROVIDER_SITE_OTHER): Payer: Medicare PPO | Admitting: Family

## 2016-10-01 ENCOUNTER — Encounter: Payer: Self-pay | Admitting: Family

## 2016-10-01 VITALS — BP 138/68 | HR 72 | Temp 98.5°F | Resp 16 | Ht 61.0 in | Wt 119.0 lb

## 2016-10-01 DIAGNOSIS — I6523 Occlusion and stenosis of bilateral carotid arteries: Secondary | ICD-10-CM | POA: Diagnosis not present

## 2016-10-01 DIAGNOSIS — Z9889 Other specified postprocedural states: Secondary | ICD-10-CM | POA: Diagnosis not present

## 2016-10-01 LAB — VAS US CAROTID
LCCADDIAS: 20 cm/s
LCCADSYS: 88 cm/s
LCCAPSYS: 89 cm/s
LEFT ECA DIAS: -12 cm/s
LICADSYS: -118 cm/s
Left CCA prox dias: 19 cm/s
Left ICA dist dias: -34 cm/s
Left ICA prox dias: -20 cm/s
Left ICA prox sys: -88 cm/s
RCCADSYS: -129 cm/s
RCCAPSYS: 100 cm/s
RIGHT CCA MID DIAS: -18 cm/s
RIGHT ECA DIAS: -11 cm/s
Right CCA prox dias: 16 cm/s

## 2016-10-01 NOTE — Addendum Note (Signed)
Addended by: Lianne Cure A on: 10/01/2016 01:48 PM   Modules accepted: Orders

## 2016-10-01 NOTE — Progress Notes (Signed)
Chief Complaint: Follow up Extracranial Carotid Artery Stenosis   History of Present Illness  [name] is a 76 y.o. female patient of Dr. Trula Slade who is status post left CEA in 2009. She returns today for follow up.  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 All strokes manifested as right side weakness, denies any subsequent strokes or TIA's.  She was admitted to [facility] in September 2017 with acute encephalopathy.   Has residual right arm and leg weakness and has fallen. Has trouble with her asthma. Patient denies heart problems, denies claudication symptoms, denies non healing wounds.  She denies post prandial abdominal pain. She states that she is physically active  The patient denies amaurosis fugax or monocular blindness.  The patient reports mild continued expressive aphasia.    Pt Diabetic: Yes, states in good control Pt smoker: former smoker, quit about 2000  Pt meds include: Statin : Yes ASA: 81 mg daily Other anticoagulants/antiplatelets: Plavix   Past Medical History:  Diagnosis Date  . Anemia   . Anxiety   . Arthritis   . Asthma   . Carotid artery occlusion   . Diabetes mellitus 59  . GERD (gastroesophageal reflux disease)   . Hyperlipidemia   . Leg pain   . Stroke ([name])   . Varicose veins     Social History Social History  Substance Use Topics  . Smoking status: Former Smoker    Types: Cigarettes    Quit date: 09/20/1996  . Smokeless tobacco: Never Used  . Alcohol use No    Family History Family History  Problem Relation Age of Onset  . Cancer Father     Colon  . Diabetes Daughter   . Hyperlipidemia Daughter   . Varicose Veins Daughter   . Stroke Other   . Heart disease Other     Surgical History Past Surgical History:  Procedure Laterality Date  . CAROTID ENDARTERECTOMY  07/26/2008   left  . CHOLECYSTECTOMY      No Known Allergies  Current Outpatient Prescriptions  Medication Sig Dispense Refill  .  albuterol (PROVENTIL HFA;VENTOLIN HFA) 108 (90 BASE) MCG/ACT inhaler Inhale 2 puffs into the lungs every 6 (six) hours as needed for wheezing or shortness of breath.     Marland Kitchen aspirin EC 81 MG tablet Take 81 mg by mouth every morning.     Marland Kitchen atorvastatin (LIPITOR) 80 MG tablet Take 80 mg by mouth daily.      . cloNIDine (CATAPRES) 0.1 MG tablet Take 0.1 mg by mouth 2 (two) times daily.    . clopidogrel (PLAVIX) 75 MG tablet Take 75 mg by mouth daily.      Marland Kitchen esomeprazole (NEXIUM) 40 MG capsule Take 40 mg by mouth daily before breakfast.      . fenofibrate micronized (LOFIBRA) 200 MG capsule Take 200 mg by mouth daily before breakfast.      . Fluticasone Furoate-Vilanterol 100-25 MCG/INH AEPB Inhale 1 puff into the lungs daily as needed (for wheezing or shortness of breath).     Marland Kitchen glipiZIDE (GLUCOTROL) 10 MG tablet Take 10 mg by mouth daily.      Marland Kitchen HYDROcodone-acetaminophen (NORCO/VICODIN) 5-325 MG per tablet Take 1 tablet by mouth 2 (two) times daily as needed for moderate pain. Hip and Shoulder pain     . metFORMIN (GLUMETZA) 500 MG (MOD) 24 hr tablet Take 500 mg by mouth daily with breakfast.      . Multiple Vitamin (MULTIVITAMIN) capsule Take 1  capsule by mouth daily.    . promethazine (PHENERGAN) 25 MG tablet Take 25 mg by mouth every 6 (six) hours as needed for nausea or vomiting.    . sertraline (ZOLOFT) 100 MG tablet Take 100 mg by mouth daily.    . vitamin B-12 (CYANOCOBALAMIN) 250 MCG tablet Take 1 tablet (250 mcg total) by mouth daily. 30 tablet 1   No current facility-administered medications for this visit.     Review of Systems : See HPI for pertinent positives and negatives.  Physical Examination  Vitals:   10/01/16 1305 10/01/16 1308  BP: 140/64 138/68  Pulse: 76 72  Resp: 16   Temp: 98.5 F (36.9 C)   TempSrc: Oral   SpO2: 98%   Weight: 119 lb (54 kg)   Height: 5\' 1"  (1.549 m)    Body mass index is 22.48 kg/m.  General: WDWN female in NAD GAIT: normal Eyes:  PERRLA Pulmonary: Respirations are non labored, CTAB, no rales,  rhonchi, or wheezing.  Cardiac: regular rhythm, no detected murmur.  VASCULAR EXAM Carotid Bruits Left Right   Negative Negative   . Radial pulses are 2+ palpable and equal.      LE Pulses LEFT RIGHT       FEMORAL 2+ palpable 2+ palpable   POPLITEAL not palpable  not palpable   POSTERIOR TIBIAL  not palpable  not  palpable    DORSALIS PEDIS  ANTERIOR TIBIAL 1+ palpable  1+ palpable     Gastrointestinal: soft, nontender, BS WNL, no r/g, no palpable masses.  Musculoskeletal: No muscle atrophy/wasting. M/S 4/5 throughout, Extremities without ischemic changes.  Neurologic: A&O X 3; Appropriate Affect ; SENSATION ;normal;  Speech is normal CN 2-12 intact, Pain and light touch intact in extremities, Motor exam as listed above       Non-Invasive Vascular Imaging CAROTID DUPLEX 09/12/2015   Right ICA: <40% stenosis. Left ICA: widely patent CEA site with no restenosis. No significant change compared to prior exam.   Assessment: [name] is a 76 y.o. female who is status post left CEA in 2009. Pt had 2 CVA's before the CEA and another in 2013. All strokes manifested as right side weakness, denies any subsequent strokes or TIA's.  DATA Today's carotid duplex suggests minimal right ICA stenosis and widely patent left CEA site with no restenosis.  Bilateral vertebral artery flow is antegrade.  Bilateral subclavian artery waveforms are normal.  No significant change compared to prior exam.  Plan: Follow-up in 1 year with Carotid Duplex scan.   I discussed in depth with the patient the nature of atherosclerosis, and emphasized the importance of maximal medical management including strict control of blood  pressure, blood glucose, and lipid levels, obtaining regular exercise, and continued cessation of smoking.  The patient is aware that without maximal medical management the underlying atherosclerotic disease process will progress, limiting the benefit of any interventions. The patient was given information about stroke prevention and what symptoms should prompt the patient to seek immediate medical care. Thank you for allowing [place] to participate in this patient's care.  Clemon Chambers, RN, MSN, FNP-C [facility] Office: [phone]  Clinic Physician: Donzetta Matters  10/01/16 1:14 PM

## 2016-10-01 NOTE — Patient Instructions (Signed)
Stroke Prevention Some medical conditions and behaviors are associated with an increased chance of having a stroke. You may prevent a stroke by making healthy choices and managing medical conditions. How can I reduce my risk of having a stroke?  Stay physically active. Get at least 30 minutes of activity on most or all days.  Do not smoke. It may also be helpful to avoid exposure to secondhand smoke.  Limit alcohol use. Moderate alcohol use is considered to be:  No more than 2 drinks per day for men.  No more than 1 drink per day for nonpregnant women.  Eat healthy foods. This involves:  Eating 5 or more servings of fruits and vegetables a day.  Making dietary changes that address high blood pressure (hypertension), high cholesterol, diabetes, or obesity.  Manage your cholesterol levels.  Making food choices that are high in fiber and low in saturated fat, trans fat, and cholesterol may control cholesterol levels.  Take any prescribed medicines to control cholesterol as directed by your health care provider.  Manage your diabetes.  Controlling your carbohydrate and sugar intake is recommended to manage diabetes.  Take any prescribed medicines to control diabetes as directed by your health care provider.  Control your hypertension.  Making food choices that are low in salt (sodium), saturated fat, trans fat, and cholesterol is recommended to manage hypertension.  Ask your health care provider if you need treatment to lower your blood pressure. Take any prescribed medicines to control hypertension as directed by your health care provider.  If you are 18-39 years of age, have your blood pressure checked every 3-5 years. If you are 40 years of age or older, have your blood pressure checked every year.  Maintain a healthy weight.  Reducing calorie intake and making food choices that are low in sodium, saturated fat, trans fat, and cholesterol are recommended to manage  weight.  Stop drug abuse.  Avoid taking birth control pills.  Talk to your health care provider about the risks of taking birth control pills if you are over 35 years old, smoke, get migraines, or have ever had a blood clot.  Get evaluated for sleep disorders (sleep apnea).  Talk to your health care provider about getting a sleep evaluation if you snore a lot or have excessive sleepiness.  Take medicines only as directed by your health care provider.  For some people, aspirin or blood thinners (anticoagulants) are helpful in reducing the risk of forming abnormal blood clots that can lead to stroke. If you have the irregular heart rhythm of atrial fibrillation, you should be on a blood thinner unless there is a good reason you cannot take them.  Understand all your medicine instructions.  Make sure that other conditions (such as anemia or atherosclerosis) are addressed. Get help right away if:  You have sudden weakness or numbness of the face, arm, or leg, especially on one side of the body.  Your face or eyelid droops to one side.  You have sudden confusion.  You have trouble speaking (aphasia) or understanding.  You have sudden trouble seeing in one or both eyes.  You have sudden trouble walking.  You have dizziness.  You have a loss of balance or coordination.  You have a sudden, severe headache with no known cause.  You have new chest pain or an irregular heartbeat. Any of these symptoms may represent a serious problem that is an emergency. Do not wait to see if the symptoms will go away.   Get medical help at once. Call your local emergency services (911 in [place] Do not drive yourself to the hospital. This information is not intended to replace advice given to you by your health care provider. Make sure you discuss any questions you have with your health care provider. Document Released: 10/14/2004 Document Revised: 02/12/2016 Document Reviewed: 03/09/2013 Elsevier  Interactive Patient Education  2017 Elsevier Inc.  

## 2016-10-21 DIAGNOSIS — F5101 Primary insomnia: Secondary | ICD-10-CM | POA: Diagnosis not present

## 2016-10-21 DIAGNOSIS — R413 Other amnesia: Secondary | ICD-10-CM | POA: Diagnosis not present

## 2016-10-21 DIAGNOSIS — I10 Essential (primary) hypertension: Secondary | ICD-10-CM | POA: Diagnosis not present

## 2016-11-19 DIAGNOSIS — R42 Dizziness and giddiness: Secondary | ICD-10-CM | POA: Diagnosis not present

## 2016-11-19 DIAGNOSIS — R51 Headache: Secondary | ICD-10-CM | POA: Diagnosis not present

## 2016-11-19 DIAGNOSIS — R413 Other amnesia: Secondary | ICD-10-CM | POA: Diagnosis not present

## 2016-12-30 DIAGNOSIS — J018 Other acute sinusitis: Secondary | ICD-10-CM | POA: Diagnosis not present

## 2016-12-30 DIAGNOSIS — F331 Major depressive disorder, recurrent, moderate: Secondary | ICD-10-CM | POA: Diagnosis not present

## 2016-12-30 DIAGNOSIS — F5105 Insomnia due to other mental disorder: Secondary | ICD-10-CM | POA: Diagnosis not present

## 2017-01-05 DIAGNOSIS — I69354 Hemiplegia and hemiparesis following cerebral infarction affecting left non-dominant side: Secondary | ICD-10-CM | POA: Diagnosis not present

## 2017-01-05 DIAGNOSIS — R0602 Shortness of breath: Secondary | ICD-10-CM | POA: Diagnosis not present

## 2017-01-05 DIAGNOSIS — K219 Gastro-esophageal reflux disease without esophagitis: Secondary | ICD-10-CM | POA: Diagnosis not present

## 2017-01-05 DIAGNOSIS — I10 Essential (primary) hypertension: Secondary | ICD-10-CM | POA: Diagnosis not present

## 2017-01-05 DIAGNOSIS — F330 Major depressive disorder, recurrent, mild: Secondary | ICD-10-CM | POA: Diagnosis not present

## 2017-01-05 DIAGNOSIS — E785 Hyperlipidemia, unspecified: Secondary | ICD-10-CM | POA: Diagnosis not present

## 2017-01-05 DIAGNOSIS — E119 Type 2 diabetes mellitus without complications: Secondary | ICD-10-CM | POA: Diagnosis not present

## 2017-01-05 DIAGNOSIS — I252 Old myocardial infarction: Secondary | ICD-10-CM | POA: Diagnosis not present

## 2017-01-05 DIAGNOSIS — M159 Polyosteoarthritis, unspecified: Secondary | ICD-10-CM | POA: Diagnosis not present

## 2017-01-05 IMAGING — MR MR HEAD W/O CM
9 of 10 series · 37 of 48 positions shown · non-contrast
Comparison: 06/06/2016 CT head.  08/24/2013 MRI brain.

CLINICAL DATA: 74 y/o F; patient presenting with confusion and
dementia. History of bilateral carotid artery stenosis, left carotid
endarterectomy, do and ischemic cerebrovascular accidents.

EXAM:
MRI HEAD WITHOUT CONTRAST
TECHNIQUE: Multiplanar, multiecho pulse sequences of the brain and surrounding
structures were obtained without intravenous contrast.

[Series 4: DWI · axial · 3.0mm · 1.09mm/px · z∈[-30,+114]mm · 11 of 98 slices shown (1 of 4)]
[im 1/98]
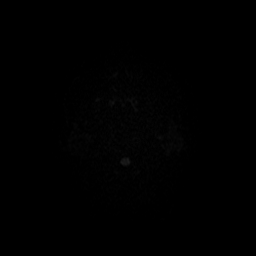
[im 10/98]
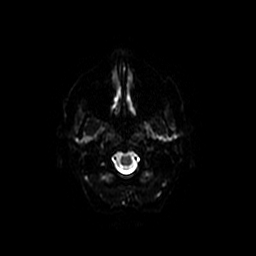
[im 20/98]
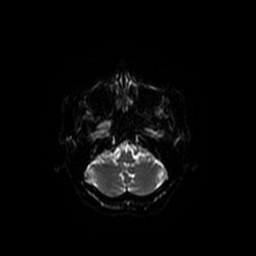
[im 30/98]
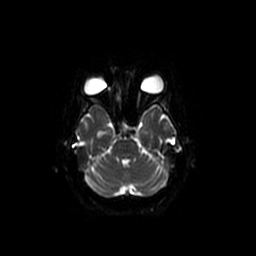
[im 39/98]
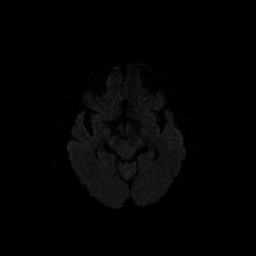
[im 49/98]
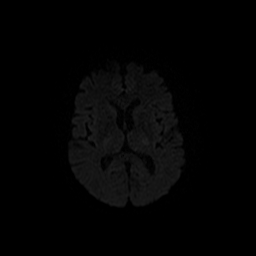
[im 59/98]
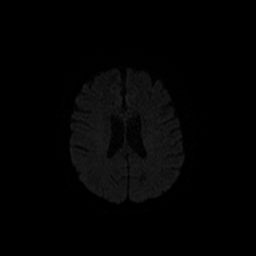
[im 68/98]
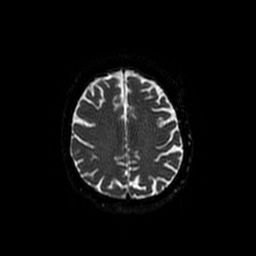
[im 78/98]
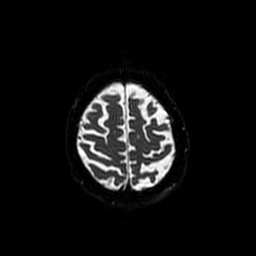
[im 88/98]
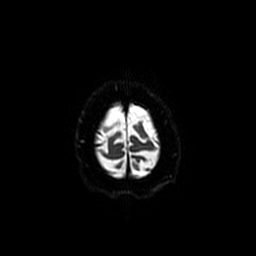
[im 98/98]
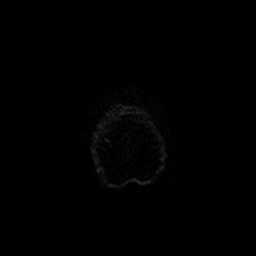

[Series 4: T1 · sagittal · 5.0mm · 0.47mm/px · 3 of 25 slices shown]
[im 1/25]
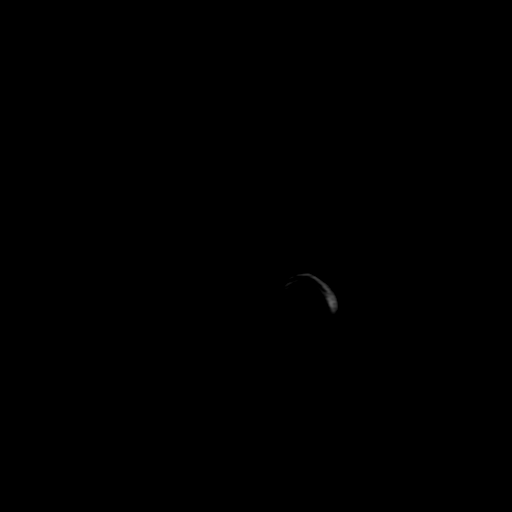
[im 13/25]
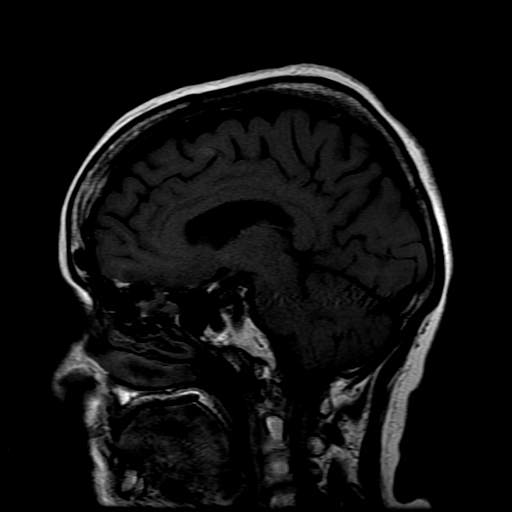
[im 25/25]
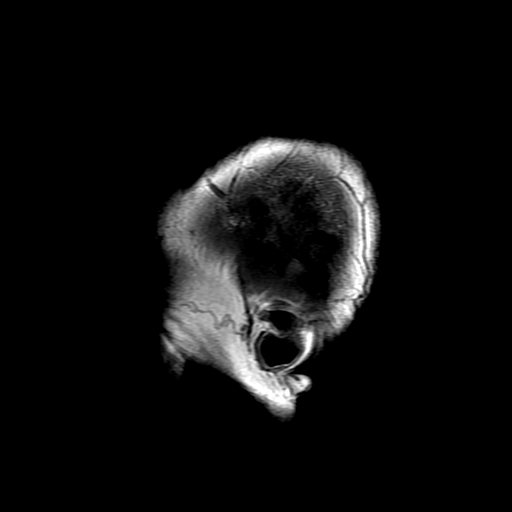

[Series 5: DWI · coronal · 5.0mm · 1.09mm/px · 7 of 66 slices shown (2 of 4)]
[im 1/66]
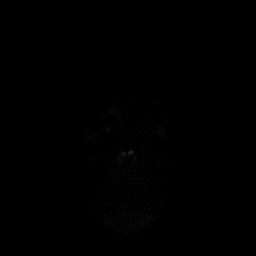
[im 11/66]
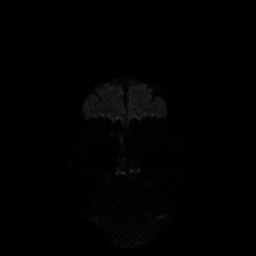
[im 22/66]
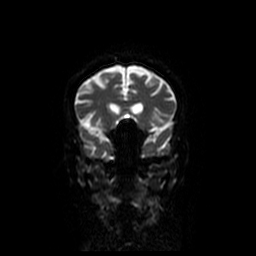
[im 33/66]
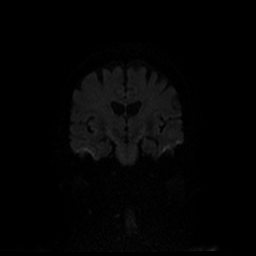
[im 44/66]
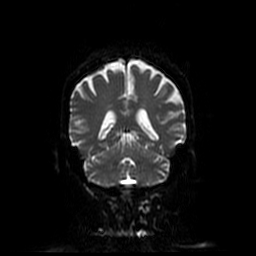
[im 55/66]
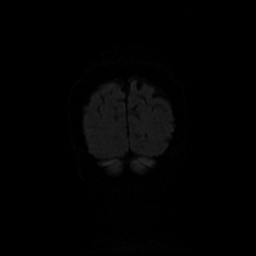
[im 66/66]
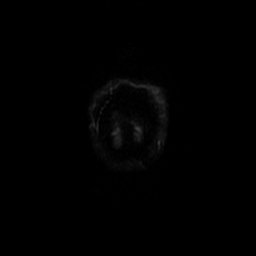

[Series 6: FLAIR · axial · 5.0mm · 0.43mm/px · z∈[-28,+112]mm · 2 of 21 slices shown]
[im 1/21]
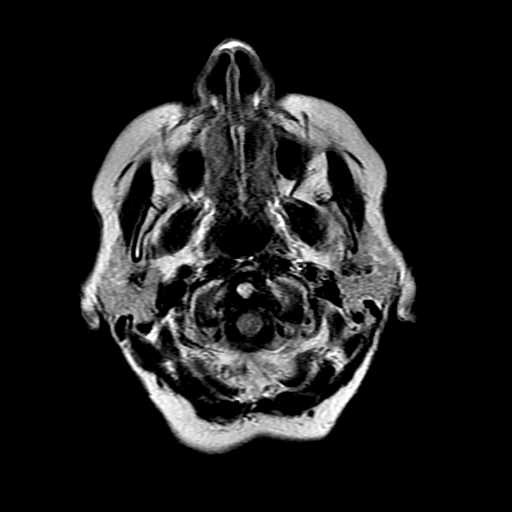
[im 21/21]
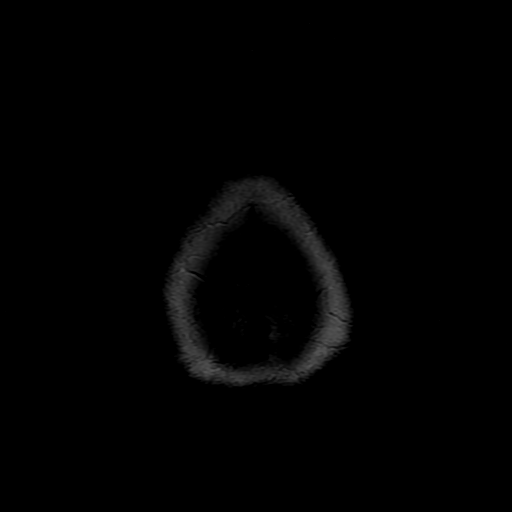

[Series 7: ax mpgr · axial · 5.0mm · 0.43mm/px · 1 of 21 slices shown]
[im 1/21]
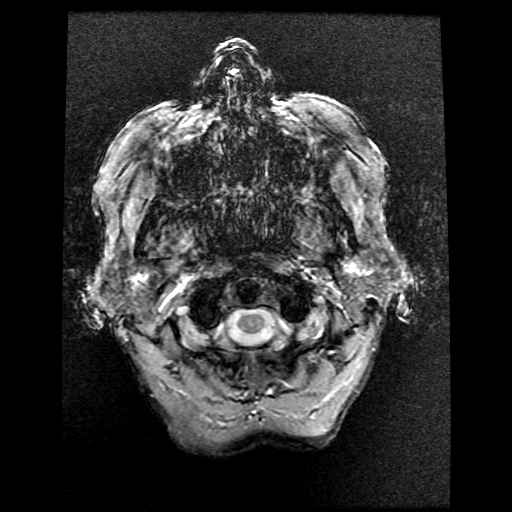

[Series 9: T2 · coronal · 5.0mm · 0.39mm/px · 3 of 25 slices shown (1 of 2)]
[im 1/25]
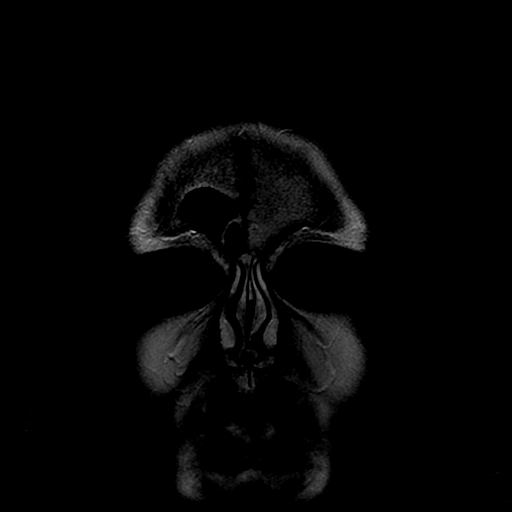
[im 13/25]
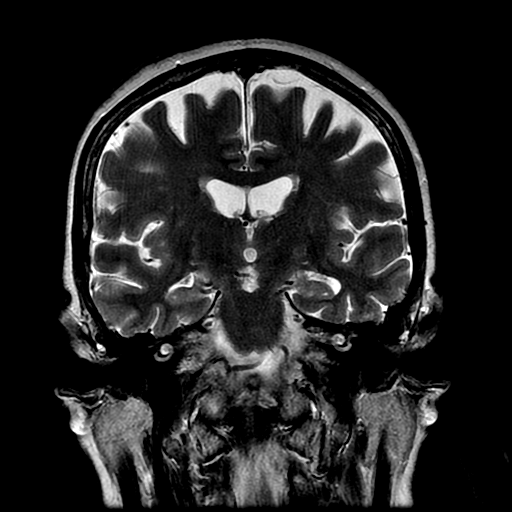
[im 25/25]
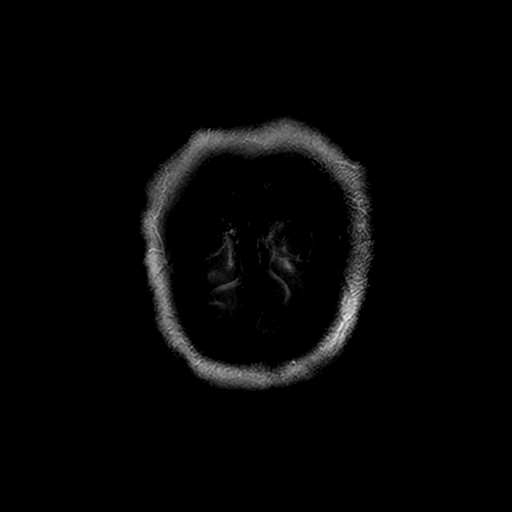

[Series 10: T2 · axial · 5.0mm · 0.43mm/px · z∈[-28,+112]mm · 2 of 21 slices shown (2 of 2)]
[im 1/21]
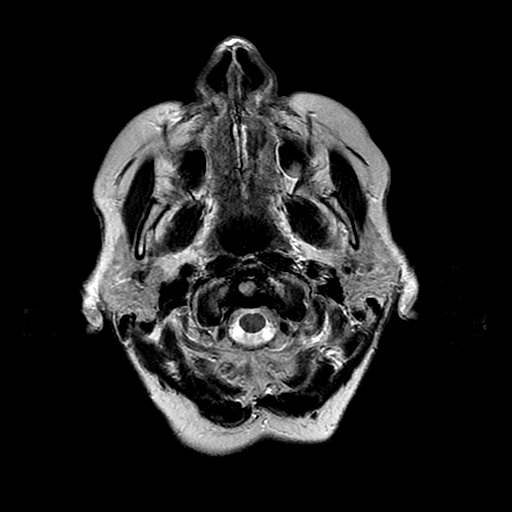
[im 21/21]
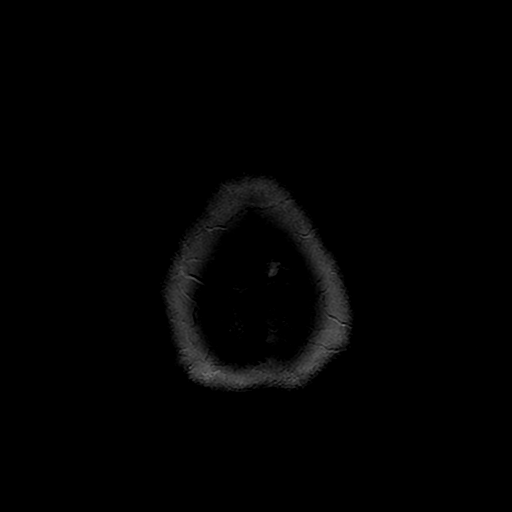

[Series 400: DWI · axial · 3.0mm · 1.09mm/px · z∈[-30,+114]mm · 5 of 49 slices shown (3 of 4)]
[im 1/49]
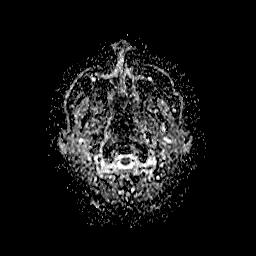
[im 13/49]
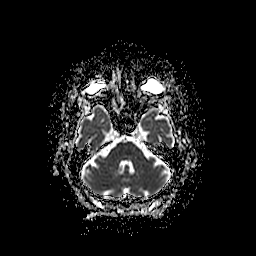
[im 25/49]
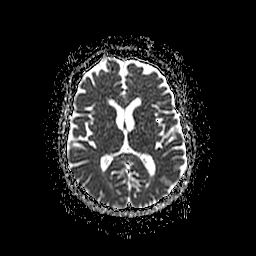
[im 37/49]
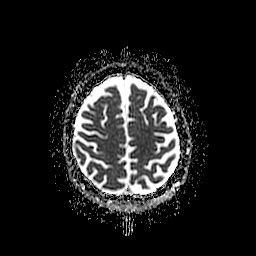
[im 49/49]
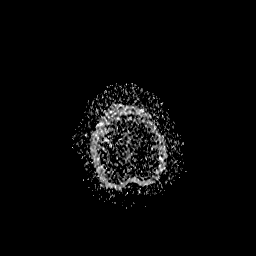

[Series 500: DWI · coronal · 5.0mm · 1.09mm/px · 3 of 33 slices shown (4 of 4)]
[im 1/33]
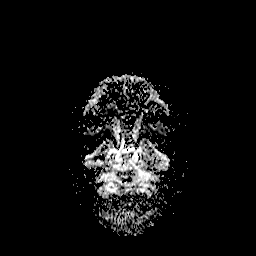
[im 17/33]
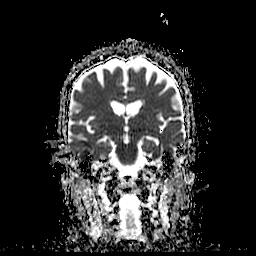
[im 33/33]
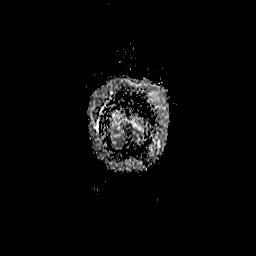

[37 of 48 positions shown; findings below may reference images not displayed]

FINDINGS: Brain: No acute infarction, hemorrhage, hydrocephalus, extra-axial
collection or mass lesion. Mild chronic microvascular ischemic
changes and parenchymal volume loss are minimally progressed.

Vascular: Normal flow voids.

Skull and upper cervical spine: Normal marrow signal.

Sinuses/Orbits: Mild mucosal thickening of the ethmoid air cells,
otherwise no abnormal signal of paranasal sinuses or mastoid air
cells. Bilateral intra-ocular lens replacement.

Other: None.
IMPRESSION: 1. No acute intracranial abnormality is identified.
2. Mild chronic microvascular ischemic changes and parenchymal
volume loss are minimally progression 9539.
3. Mild paranasal sinus disease.

By: Rosa Peralta Umaquinga M.D.

## 2017-01-06 DIAGNOSIS — I10 Essential (primary) hypertension: Secondary | ICD-10-CM | POA: Diagnosis not present

## 2017-01-06 DIAGNOSIS — E782 Mixed hyperlipidemia: Secondary | ICD-10-CM | POA: Diagnosis not present

## 2017-01-06 DIAGNOSIS — J4541 Moderate persistent asthma with (acute) exacerbation: Secondary | ICD-10-CM | POA: Diagnosis not present

## 2017-01-06 DIAGNOSIS — E1142 Type 2 diabetes mellitus with diabetic polyneuropathy: Secondary | ICD-10-CM | POA: Diagnosis not present

## 2017-01-06 DIAGNOSIS — R413 Other amnesia: Secondary | ICD-10-CM | POA: Diagnosis not present

## 2017-01-06 DIAGNOSIS — I69854 Hemiplegia and hemiparesis following other cerebrovascular disease affecting left non-dominant side: Secondary | ICD-10-CM | POA: Diagnosis not present

## 2017-01-27 DIAGNOSIS — J4521 Mild intermittent asthma with (acute) exacerbation: Secondary | ICD-10-CM | POA: Diagnosis not present

## 2017-02-07 DIAGNOSIS — F5105 Insomnia due to other mental disorder: Secondary | ICD-10-CM | POA: Diagnosis not present

## 2017-02-07 DIAGNOSIS — F320 Major depressive disorder, single episode, mild: Secondary | ICD-10-CM | POA: Diagnosis not present

## 2017-02-19 DIAGNOSIS — E119 Type 2 diabetes mellitus without complications: Secondary | ICD-10-CM | POA: Diagnosis not present

## 2017-03-16 DIAGNOSIS — K5909 Other constipation: Secondary | ICD-10-CM | POA: Diagnosis not present

## 2017-03-16 DIAGNOSIS — F331 Major depressive disorder, recurrent, moderate: Secondary | ICD-10-CM | POA: Diagnosis not present

## 2017-03-16 DIAGNOSIS — F5105 Insomnia due to other mental disorder: Secondary | ICD-10-CM | POA: Diagnosis not present

## 2017-03-16 DIAGNOSIS — F430 Acute stress reaction: Secondary | ICD-10-CM | POA: Diagnosis not present

## 2017-04-18 DIAGNOSIS — F321 Major depressive disorder, single episode, moderate: Secondary | ICD-10-CM | POA: Diagnosis not present

## 2017-04-18 DIAGNOSIS — R10816 Epigastric abdominal tenderness: Secondary | ICD-10-CM | POA: Diagnosis not present

## 2017-04-18 DIAGNOSIS — R10814 Left lower quadrant abdominal tenderness: Secondary | ICD-10-CM | POA: Diagnosis not present

## 2017-04-18 DIAGNOSIS — N3000 Acute cystitis without hematuria: Secondary | ICD-10-CM | POA: Diagnosis not present

## 2017-05-20 DIAGNOSIS — R10816 Epigastric abdominal tenderness: Secondary | ICD-10-CM | POA: Diagnosis not present

## 2017-05-20 DIAGNOSIS — R10814 Left lower quadrant abdominal tenderness: Secondary | ICD-10-CM | POA: Diagnosis not present

## 2017-05-20 DIAGNOSIS — F321 Major depressive disorder, single episode, moderate: Secondary | ICD-10-CM | POA: Diagnosis not present

## 2017-07-11 DIAGNOSIS — E782 Mixed hyperlipidemia: Secondary | ICD-10-CM | POA: Diagnosis not present

## 2017-07-11 DIAGNOSIS — I10 Essential (primary) hypertension: Secondary | ICD-10-CM | POA: Diagnosis not present

## 2017-07-11 DIAGNOSIS — E1142 Type 2 diabetes mellitus with diabetic polyneuropathy: Secondary | ICD-10-CM | POA: Diagnosis not present

## 2017-07-11 DIAGNOSIS — I69854 Hemiplegia and hemiparesis following other cerebrovascular disease affecting left non-dominant side: Secondary | ICD-10-CM | POA: Diagnosis not present

## 2017-07-11 DIAGNOSIS — F331 Major depressive disorder, recurrent, moderate: Secondary | ICD-10-CM | POA: Diagnosis not present

## 2017-07-11 DIAGNOSIS — Z23 Encounter for immunization: Secondary | ICD-10-CM | POA: Diagnosis not present

## 2017-10-03 ENCOUNTER — Encounter (HOSPITAL_COMMUNITY): Payer: Medicare Other

## 2017-10-03 ENCOUNTER — Ambulatory Visit: Payer: Medicare Other | Admitting: Family

## 2017-10-04 DIAGNOSIS — F5105 Insomnia due to other mental disorder: Secondary | ICD-10-CM | POA: Diagnosis not present

## 2017-10-04 DIAGNOSIS — R5383 Other fatigue: Secondary | ICD-10-CM | POA: Diagnosis not present

## 2017-10-04 DIAGNOSIS — F322 Major depressive disorder, single episode, severe without psychotic features: Secondary | ICD-10-CM | POA: Diagnosis not present

## 2017-10-27 DIAGNOSIS — E782 Mixed hyperlipidemia: Secondary | ICD-10-CM | POA: Diagnosis not present

## 2017-10-27 DIAGNOSIS — I69854 Hemiplegia and hemiparesis following other cerebrovascular disease affecting left non-dominant side: Secondary | ICD-10-CM | POA: Diagnosis not present

## 2017-10-27 DIAGNOSIS — I10 Essential (primary) hypertension: Secondary | ICD-10-CM | POA: Diagnosis not present

## 2017-10-27 DIAGNOSIS — R413 Other amnesia: Secondary | ICD-10-CM | POA: Diagnosis not present

## 2017-10-27 DIAGNOSIS — E1142 Type 2 diabetes mellitus with diabetic polyneuropathy: Secondary | ICD-10-CM | POA: Diagnosis not present

## 2017-10-27 DIAGNOSIS — F331 Major depressive disorder, recurrent, moderate: Secondary | ICD-10-CM | POA: Diagnosis not present

## 2017-10-31 ENCOUNTER — Other Ambulatory Visit: Payer: Self-pay

## 2017-10-31 NOTE — Patient Outreach (Signed)
[facility]) Care Management  10/31/2017  [name] [birth year] [id number]   Referral Date: 10/28/17 Referral Source: MD Referral Referral Reason: Medication management   Outreach Attempt: return call to daughter [name].  Patient had previously given CM permission for CM to speak with her.  Daughter is able to verify HIPAA.  She states that her mom is having issues with remembering to take her medications.  She states that patient has gone down hill since her husband passed in June 2018.  She states that her brother and his family has moved in and really bothers patient on top of dealing with the loss of her husband.     Social: Patient lives in her home.  Patient son and his family moved in after husband passed and do not offer any support to patient.  Patient daughter [name] comes daily to assist patient with meals and grocery shopping.      Conditions: Patient has diabetes, depression, anxiety, and hypertension per daughter.  Daughter states patient says her sugars are good but her last A1c was 7.0.   Medications: Daughter states patient on several medications but patient does not take them as she should as patient has problems remembering medications.     Appointments: Patient saw Dr. Tobie Poet last week for a check up.  Daughter takes patient to appointments.     Advanced Directives: Patient does not have an advanced directive but daughter states that they are interested in completing one.     Consent: Discussed [facility] Care Management Services. Daughter gives consent for services.     Plan: RN CM will refer to pharmacy to help with medication management as patient is forgetful with medications. RN CM will refer to SW to help with completing Advanced Directive RN CM will refer to health coach for education and support for disease management of diabetes.      Jone Baseman, RN, MSN [facility] Care Management Care Management Coordinator Direct Line [phone] Toll  Free: [phone]  Fax: [phone]

## 2017-10-31 NOTE — Patient Outreach (Signed)
[facility]) Care Management  10/31/2017  [name] [birth year] [id number]   Telephone call to patient for MD referral. Spoke with patient who is able to verify HIPAA.  She states that her daughter helps her as she has gone down hill after her husband passed. She gives permission to speak with her daughter [name] and gives number of [phone].  Telephone cal to daughter [name]. No answer.  HIPAA compliant voice message left.  Plan: RN CM will attempt call again in one business day.    Jone Baseman, RN, MSN [facility] Care Management Care Management Coordinator Direct Line [phone] Toll Free: [phone]  Fax: [phone]

## 2017-11-01 ENCOUNTER — Encounter: Payer: Self-pay | Admitting: *Deleted

## 2017-11-01 ENCOUNTER — Ambulatory Visit: Payer: Self-pay

## 2017-11-02 ENCOUNTER — Telehealth: Payer: Self-pay | Admitting: Pharmacist

## 2017-11-02 NOTE — Patient Outreach (Signed)
[facility] [facility]) Care Management  11/02/2017  ASJIA BERRIOS 08/14/1941 [id number]   77 y.o. year old female referred to [place] for Medication Adherence and Medication Management (Pharmacy telephone outreach) Per [facility] CM - I am to call her daughter, [name], who has permission from the patient to speak about medications and health information.   PMH s/f: carotid artery stenosis, hypertension, GERD, type 2 diabetes, acute encephalopathy, history of stroke with residual deficit, depression, normocytic anemia  Patient with [employer] [place]   Patient confirms identity with HIPAA-identifiers x2 and gives verbal consent to speak over the phone about medications.   Daughter and patient agreeable to home visit on 11/08/17 at 1PM. Reports she is on the way to the pharmacy [facility]) to pick up a new "memory pill". Asked daughter to make sure she has all medications refilled before our visit. Will see patient in her home 11/08/17.   Carlean Jews, Pharm.D., BCPS PGY2 Ambulatory Care Pharmacy Resident Phone: [phone]

## 2017-11-04 ENCOUNTER — Other Ambulatory Visit: Payer: Self-pay | Admitting: *Deleted

## 2017-11-04 NOTE — Patient Outreach (Signed)
[facility]) Care Management  11/04/2017  [name] 09/08/1941 [id number]   CSW was able to make initial contact with patient today to introduce self,  role and reason for my call.  CSW obtained HIPAA compliant identifiers from patient, which included patient's name , address and date of birth.  CSW discussed the request to assist with Advance Directives to which patient reports her daughter is working on for her.  Patient was unsure if her daughter has the paperwork yet and wants CSW to speak with her daughter- CSW provided patient with contact info and will await their return call. CSW will plan f/u call next week if no return call received from patient at daughter.   Eduard Clos, MSW, [place] Worker  [place] [phone]

## 2017-11-07 ENCOUNTER — Other Ambulatory Visit: Payer: Self-pay | Admitting: *Deleted

## 2017-11-07 NOTE — Patient Outreach (Signed)
[facility]) Care Management  11/07/2017  [name] [birth year] [id number]   [place] Introductory Outreach  Referral Date:  10/31/2017 Referral Source:  MD Referral Reason for Referral:  "Medication Management" Insurance:  Medicare Part  A&B & [employer] Medicare Choice   Outreach Attempt:  Outreach attempt #1 to patient for introductory call and arrange initial telephone visit. No answer at home or cell phone numbers.  Unable to leave voicemail message due to voicemail not arranged on cell phone and mailbox being full on the home number.  Plan:  RN Health Coach will make another outreach attempt to patient within one business day if no return call back from patient.   [place] [phone] [email or web address]

## 2017-11-08 ENCOUNTER — Other Ambulatory Visit: Payer: Self-pay | Admitting: *Deleted

## 2017-11-08 ENCOUNTER — Other Ambulatory Visit: Payer: Self-pay | Admitting: Pharmacist

## 2017-11-08 NOTE — Patient Outreach (Signed)
[name]) Care Management  [place]   11/09/2017  NAVIYAH SCHAFFERT 08/19/1941 [id number]  77 y.o. year old female referred to [facility] for Medication Management and Medication Adherence (Pharmacy home visit)  PMH s/f: h/o stroke with residual deficits (memory loss per patient and daughter), diabetes typ 2, HTN, carotid artery stenosis, normocytic anemia, depression, GERD  Patient with [employer] medicare advantage plan.    Patient confirms identity with HIPAA-identifiers x2 however cannot state address accurately. Daughter, [name], is present and helps with interview. Patient states that it is OK to disclose personal health and financial information to daughter.   Subjective:  She states that her memory is "bad" and she relies on her daughter. [name] (daughter) is in the home daily to help with chores, transportation, and meal preparation.   States that she cannot remember when her upcoming eye exam is scheduled for. Endorses increased stress with recent deaths in the family (husband last father's day, daughter-in-law a few months ago) and from son and grandchildren (aged 66,13 and 86) moving in with her.   Medication adherence: Patient reports that she has a hard time keeping her medications straight. She presents all prescription medications that she keeps in a box. Multiple expired medications noted. Patient requests pill box and asks for help filling it. States that she takes venlafaxine as needed and refers to it as her "nerve pill". Has been taking most days recently. Also has sample bottle of vortioxetine with 3 tablets left in it. She states she also takes this as needed. Taking mirtazapine at night but there are no refills. Reports she used to be on plavix but has not taken in a while, "I don't know why I don't take it anymore?". Bottle of plavix (in date) found in medicine cabinet. Denies having a medical provider advise her to stop. Denies bleeding or bruising,  dark tarry or blood in stools, stomach pain. Denies having prescription for inhalers in several years. Denies any issues with breathing at present. Reports she is out of Norco prescription and is taking nabumetone 500 mg once a day as needed (prescribed BID PRN). Also out of vitamin B12. Has several bottles of metformin 1000 mg but is taking 500 mg daily.   Mediation management:  1. GERD : Patient reports she has acid reflux symptoms "bad" and takes "lots" of rolaids, estimating 2-6 tablets daily in addition to her nexium 40 mg daily.  2. Depression/anxiety: reports taking venlafaxine PRN but nearly every day and also taking vortioxetine PRN (infrequently). States "my nerves are bad". Denies tremors, hyperreflexia, tachycardia, diarrhea, abnormal sweating. 3. Diabetes: She reports increased appetite recently and states she has been eating a lot of icecream. Denies knowing where her blood glucose meter is. States it is at least two years old. Reports she has not checked blood sugar in several weeks and cannot recall what her blood sugars typically run. Endorses polydipsia and polyuria, denies polyphagia but thinks she is gaining weight.  4. Falls: Patient reports frequent falls (2 in the last 2 weeks) as she often trips over clutter in the home. Denies injury, weakness, visual changes. Patient identifies signs/symptoms of stroke as: balance changes, weakness, visual changes.  5. HTN: denies checking blood pressure, does not take any medications for this.  Medication assistance: States that medications are affordable. Is interested in pill packaging but does not want to leave her pharmacy.  Reports she makes $1500/month but once we began discussing, it appears that she may make  more money than that. Agrees to apply for low income subsidy/extra help.    Objective:  Sherron Monday, RN at Dr. Alyse Low office to confirm medication list, listed below. Also confirmed problem list.  Venlafazine 75 ER  daily Fenofibrate 200 mg daily Atorvastatin 80 mg daily Esomeparzole 40 mg daily Hydrocodone-apap 5-325 mg BID (chronic med) Metformin IR 500 mg  daily Clopidogrel 75 mg daily proair inhaler 2 puffs q4-6 hrs prn Vitamin b12 OTC daily Aspirin 81 daily  lipid panel 10/27/2017 = HLD 60, total cholesterol 179, triglycerides 78 LDL 01/06/17 = 112 A1C 10/27/2017 = 7% BMET 10/27/2017 = BUN 8, SCr 0.8 Microalbumin, Urine 07/11/2017 = 10  eGFR 04/18/2017 = 87 CrCl, estimated 10/27/2017 = 48.72 ml/min  Last flu vaccine 07/11/2017  Per [place] Controlled substances reporting system:  12/27/2016 hydrocodone-acetaminophen 5-325 mg #60 for 30 days (written by "Dr. Zeb Comfort P") 09/21/2016 hydrocodone-acetaminophen 5-325 mg #60 for 30 days (written by "Dr. Joycelyn Schmid Cox")  BMI 10/27/2017 = 21.43  Encounter Medications: Outpatient Encounter Medications as of 11/08/2017  Medication Sig Note  . aspirin EC 81 MG tablet Take 81 mg by mouth every morning.  06/06/2016: Pt does not remember what time  . atorvastatin (LIPITOR) 80 MG tablet Take 80 mg by mouth daily.     . Ca Carbonate-Mag Hydroxide (ROLAIDS) 550-110 MG CHEW Chew 2 tablets by mouth daily as needed.    Marland Kitchen esomeprazole (NEXIUM) 40 MG capsule Take 40 mg by mouth daily before breakfast.     . fenofibrate micronized (LOFIBRA) 200 MG capsule Take 200 mg by mouth daily before breakfast.     . metFORMIN (GLUCOPHAGE) 500 MG tablet Take 500 mg by mouth daily.   . mirtazapine (REMERON) 30 MG tablet Take 30 mg by mouth at bedtime.   . Multiple Vitamin (MULTIVITAMIN) capsule Take 1 capsule by mouth daily.   . nabumetone (RELAFEN) 500 MG tablet Take 500 mg by mouth daily as needed. 11/08/2017: Prescribed 500 mg BID, taking 1 daily as needed ~3x/week  . venlafaxine XR (EFFEXOR-XR) 75 MG 24 hr capsule Take 75 mg by mouth daily with breakfast.   . vitamin B-12 (CYANOCOBALAMIN) 250 MCG tablet Take 1 tablet (250 mcg total) by mouth daily.   . clopidogrel (PLAVIX) 75 MG  tablet Take 75 mg by mouth daily.     Marland Kitchen HYDROcodone-acetaminophen (NORCO/VICODIN) 5-325 MG per tablet Take 1 tablet by mouth 2 (two) times daily as needed for moderate pain. Hip and Shoulder pain    . [DISCONTINUED] albuterol (PROVENTIL HFA;VENTOLIN HFA) 108 (90 BASE) MCG/ACT inhaler Inhale 2 puffs into the lungs every 6 (six) hours as needed for wheezing or shortness of breath.    . [DISCONTINUED] cloNIDine (CATAPRES) 0.1 MG tablet Take 0.1 mg by mouth 2 (two) times daily.   . [DISCONTINUED] Fluticasone Furoate-Vilanterol 100-25 MCG/INH AEPB Inhale 1 puff into the lungs daily as needed (for wheezing or shortness of breath).    . [DISCONTINUED] glipiZIDE (GLUCOTROL) 10 MG tablet Take 10 mg by mouth daily.     . [DISCONTINUED] metFORMIN (GLUMETZA) 500 MG (MOD) 24 hr tablet Take 500 mg by mouth daily with breakfast.     . [DISCONTINUED] promethazine (PHENERGAN) 25 MG tablet Take 25 mg by mouth every 6 (six) hours as needed for nausea or vomiting.   . [DISCONTINUED] sertraline (ZOLOFT) 100 MG tablet Take 100 mg by mouth daily.    No facility-administered encounter medications on file as of 11/08/2017.     Functional Status: In your  present state of health, do you have any difficulty performing the following activities: 11/09/2017  Hearing? N  Vision? Y  Difficulty concentrating or making decisions? Y  Walking or climbing stairs? N  Dressing or bathing? N  Doing errands, shopping? Y  Some recent data might be hidden    Fall/Depression Screening: Fall Risk  11/08/2017  Falls in the past year? Yes  Number falls in past yr: 2 or more  Injury with Fall? No  Follow up Education provided;Falls prevention discussed   PHQ 2/9 Scores 11/08/2017 10/31/2017  PHQ - 2 Score 3 4  PHQ- 9 Score 10 12     Drugs sorted by system:  Neurologic/Psychologic: plavix, aspirin, venlafaxine, mirtazapine  Cardiovascular: atorvastatin, fenofibrate  Gastrointestinal: esomeprazole, rolaids as  needed  Endocrine:metformin  Pain:hydrocodone-acetaminophen (per pt report this is a chronic med, per pull of data from [place] Controlled substances reporting system, this has been filled twice in the last year.), nalbumetone   Vitamins/Minerals:b12 (not currently taking), MVI  Duplications in therapy: vortioxetine (NOT prescribed any longer per medical record), venlafaxine- do not suspect serotonin syndrome Medications to avoid in the elderly: PPI, narcotics  Drug interactions: none clinically significant  Other issues noted: GERD not treated adequately per patient report; need clarification from Dr. Alyse Low office on multiple meds, needs new glucometer; increased appetite on mirtazapine ?desireable in patient with diabetes and normal BMI.   Plan:  - Educated S/sx stroke, educated on falls reduction and provided hand out.  - Offered pill packaging however patient declines. Patient accepts pill boxes and boxes were filled x2 weeks.  - Counseled on taking venlafaxine daily, d/c of vortioxetine. Counseled on adherence to all medications listed by Dr. Alyse Low nurse.  -Applied for low income subsidy/extra help with patient's permission -Discovered eye appoint is scheduled for 11/25/17 - with [facility] at 1:30 PM -Made medication list for patient and wrote down medications she needs to pick up at the pharmacy (b12, mirtazapine if she is to continue on this, plavix) -Separated expired/old OTC medications/medications she is no longer taking for disposal: amoxicillin, lipozyne, metformin 1000 mg, several expired bottles of atorvastatin -Called Dr. Alyse Low office and left message regarding clarification around: Mirtazapine, hydorocodone-acetaminophen, plavix, inhalers (fluticasone and vilanterol, albuterol listed in medical record however patient not taking), needs new glucometer, needs H2RA to augment nexium potentially, inform about increased appetite on mirtazapine  Will follow up with patient next  week for another home visit to address education surrounding diabetes, exercise, deit. Social work and nursing already consulted but patient could benefit from falls education, at the minimum.   Carlean Jews, Pharm.D., BCPS PGY2 Ambulatory Care Pharmacy Resident Phone: [phone]

## 2017-11-08 NOTE — Patient Outreach (Signed)
[facility]) Care Management  11/08/2017  IZZABELLE BOULEY 1940/10/18 [id number]   [facility] Introductory Outreach  Referral Date:  10/31/2017 Referral Source:  MD Referral Reason for Referral:  "Medication Management" Insurance:  Medicare Part  A&B & [employer] Medicare Choice   Outreach Attempt:  Successful telephone outreach to patient for introductory call.  HIPAA confirmed with patient.  RN Health Coach introduced self and role to patient.  Patient verbally agreeing to monthly outreach calls for education.  Discussed initial telephone assessment with patient and she request, RN Health Coach to speak with daughter [name].  Successful telephone outreach to daughter [name].  HIPAA verified with daughter.  RN Health Coach introduced self and role to daughter and arranged time for initial telephone assessment to be completed.  Plan:  RN Health Coach will make telephone outreach to complete initial telephone assessment within the next 10 business days.  [place] [phone] [email or web address]

## 2017-11-09 DIAGNOSIS — R413 Other amnesia: Secondary | ICD-10-CM | POA: Diagnosis not present

## 2017-11-10 ENCOUNTER — Ambulatory Visit: Payer: Self-pay | Admitting: *Deleted

## 2017-11-10 ENCOUNTER — Other Ambulatory Visit: Payer: Self-pay | Admitting: *Deleted

## 2017-11-11 ENCOUNTER — Other Ambulatory Visit: Payer: Self-pay | Admitting: *Deleted

## 2017-11-11 ENCOUNTER — Encounter: Payer: Self-pay | Admitting: *Deleted

## 2017-11-11 ENCOUNTER — Ambulatory Visit: Payer: Self-pay | Admitting: *Deleted

## 2017-11-13 DIAGNOSIS — R05 Cough: Secondary | ICD-10-CM | POA: Diagnosis not present

## 2017-11-13 DIAGNOSIS — E119 Type 2 diabetes mellitus without complications: Secondary | ICD-10-CM | POA: Diagnosis not present

## 2017-11-13 DIAGNOSIS — J111 Influenza due to unidentified influenza virus with other respiratory manifestations: Secondary | ICD-10-CM | POA: Diagnosis not present

## 2017-11-14 ENCOUNTER — Other Ambulatory Visit: Payer: Self-pay | Admitting: *Deleted

## 2017-11-14 ENCOUNTER — Encounter: Payer: Self-pay | Admitting: *Deleted

## 2017-11-14 NOTE — Patient Outreach (Addendum)
[facility]) Care Management  [place]  11/14/2017   NORITA MEIGS Oct 25, 1940 [id number]   RN Health Coach Initial Assessment  Referral Date:  10/31/2017 Referral Source:  MD Referral Reason for Referral:  "Medication Management" Insurance:  [employer] Medicare   Outreach Attempt:  Successful telephone outreach to patient for initial telephone assessment.  HIPAA verified with patient.  Patient requesting RN Health Coach speak with daughter, [name].  Initial telephone assessment completed with patient and daughter.  Social:  Patient lives at home with son and 3 grandchildren.  States increasing feeling of depression and anxiety since the death of her husband and daughter-in-law and now that her son and grandchildren are staying with her.  Verbalizes it is difficult having her family in the home with her.  Patient also verbalizes increasing forgetfulness.  Reports being independent with ADLs and daughter assist with IADLs (billing/household management).  Patient states, daughter, [name] is the primary caregiver and comes over daily to assist with her care.  Ambulates with a straight cane at times, reporting multiple falls over the last year.  Last fall was this past Saturday.  Fall was without injury.  Discussed with patient and daughter the possibility of home health therapy to do safety evaluation, assist with deconditioning, and evaluate need for walker; patient refusing at this time.  DME in the home include dentures, eye glasses, shower chair with back, and CBG meter they can not find at this time.  Daughter transports patient to medical appointments.  Conditions:   Per chart review and discussion with patient and daughter, PMH include:  Diabetes, depression, GERD, hypertension, anemia, stroke, carotid artery disease with left carotid endarectomy, chronic hip and shoulder pain, hyperlipidemia, asthma, arthritis, and anxiety.  Patient reports not feeling well and having an ED  visit on yesterday 11/13/2017.  Currently being treated for the flu.  Patient encouraged to continue with prescribed medications and to try to stay hydrated.  Daughter reports she is unsure where Patient's blood sugar meter is.  Patient stating she monitors her blood sugars daily but when asked about the meter, she is unable to state where her meter is.  She is also not able to state what her blood sugar was this morning.  Encouraged patient and daughter to seek assistance from her provider and obtain prescription for new meter.  Patient unsure of the last time her blood had been taken in the home.  Last Hgb A1C was 7 on 10/27/2017.    Medications:  Prior to last week, patient was managing her medications, forgetting to take medications.  Daughter states after clarification of medications and with the assistance of [facility] Pharmacist with creating pill boxes, daughter will now manage medications.  Daughter and patient stating they have obtained prescriptions from pharmacy called in late last week and reports compliance with pill boxes.  Denies any trouble affording medications.  Encounter Medications:  Outpatient Encounter Medications as of 11/14/2017  Medication Sig Note  . albuterol (PROVENTIL HFA;VENTOLIN HFA) 108 (90 Base) MCG/ACT inhaler Inhale 1-2 puffs into the lungs daily as needed for wheezing or shortness of breath.   Marland Kitchen aspirin EC 81 MG tablet Take 81 mg by mouth every morning.  06/06/2016: Pt does not remember what time  . atorvastatin (LIPITOR) 80 MG tablet Take 80 mg by mouth daily.     . Ca Carbonate-Mag Hydroxide (ROLAIDS) 550-110 MG CHEW Chew 2 tablets by mouth daily as needed.    . clopidogrel (PLAVIX) 75 MG tablet Take  75 mg by mouth daily.     Marland Kitchen esomeprazole (NEXIUM) 40 MG capsule Take 40 mg by mouth daily before breakfast.     . fenofibrate micronized (LOFIBRA) 200 MG capsule Take 200 mg by mouth daily before breakfast.     . HYDROcodone-acetaminophen (NORCO/VICODIN) 5-325 MG per tablet Take  1 tablet by mouth 2 (two) times daily as needed for moderate pain. Hip and Shoulder pain    . metFORMIN (GLUCOPHAGE) 500 MG tablet Take 500 mg by mouth daily.   . mirtazapine (REMERON) 30 MG tablet Take 30 mg by mouth at bedtime.   . Multiple Vitamin (MULTIVITAMIN) capsule Take 1 capsule by mouth daily.   Marland Kitchen venlafaxine XR (EFFEXOR-XR) 75 MG 24 hr capsule Take 75 mg by mouth daily with breakfast.   . vitamin B-12 (CYANOCOBALAMIN) 250 MCG tablet Take 1 tablet (250 mcg total) by mouth daily.   . nabumetone (RELAFEN) 500 MG tablet Take 500 mg by mouth daily as needed. 11/14/2017: Not taking per Daughter    No facility-administered encounter medications on file as of 11/14/2017.     Functional Status:  In your present state of health, do you have any difficulty performing the following activities: 11/14/2017 11/09/2017  Hearing? N N  Vision? Y Y  Difficulty concentrating or making decisions? Tempie Donning  Walking or climbing stairs? N N  Dressing or bathing? N N  Doing errands, shopping? Tempie Donning  Preparing Food and eating ? N -  Using the Toilet? N -  In the past six months, have you accidently leaked urine? Y -  Do you have problems with loss of bowel control? N -  Managing your Medications? Y -  Managing your Finances? Y -  Housekeeping or managing your Housekeeping? Y -  Some recent data might be hidden    Fall/Depression Screening: Fall Risk  11/14/2017 11/08/2017  Falls in the past year? Yes Yes  Number falls in past yr: 2 or more 2 or more  Injury with Fall? No No  Risk Factor Category  High Fall Risk -  Risk for fall due to : History of fall(s);Impaired balance/gait;Impaired mobility -  Follow up Education provided;Falls prevention discussed Education provided;Falls prevention discussed   PHQ 2/9 Scores 11/14/2017 11/08/2017 10/31/2017  PHQ - 2 Score 3 3 4   PHQ- 9 Score 10 10 12     [facility] CM Care Plan Problem One     Most Recent Value  Care Plan Problem One  Knowldege deficiet related to self  care of diabetes.  Role Documenting the Problem One  [place] for Problem One  Active  [facility] Long Term Goal   Patient will verbalize no hospitalizations or emergency room visits in the next 90 days.  [facility]N Long Term Goal Start Date  11/14/17  Interventions for Problem One Long Term Goal  Current care plan discussed and reviewed with patient and daughter, encouraged patient and daughter to schedule follow up medical appointment and attend appointment,  discussed medication compliance, encouragad patient and daughter to place medications in safe location, encouraged daughter to manage medications for patient,  discussed possible need for counseling for depression for patient  [facility] CM Short Term Goal #1   Patient will verbalize no falls in the next 30 days.  THN CM Short Term Goal #1 Start Date  11/14/17  Interventions for Short Term Goal #1  Reinforced Falls Precaution EMMI sheet given to patient by [facility] Pharmacist, encouraged patient to utilize cane at all times  of ambulation, Falls precautions and preventions discussed with patient and daughter, encouraged patient and daughter to speak with son and grandkids about keeping things off the floor to prevent falls/triping, discussed with patient and daughter the possibility of home health therapy for safety evaluation of mobility and home to assess need for walker  (patient refusing at this time),  encouraged patient and daughter to discuss falls and therapy with primary care physician, encouraged patient and daughter to discuss fall preventions with son, encouraged patient to transition from sitting to standing position slowly to prevent falls  [facility] CM Short Term Goal #2   Patient and daughter will verbalize patient has obtained a CBG meter in the next 30 days.  THN CM Short Term Goal #2 Start Date  11/14/17  Interventions for Short Term Goal #2  Reviewed and discussed need for blood sugar monitoring with patient and daughter, reviewed signs and  symptoms of hypo and hyperlgycemia and treatment options with daughter, encouraged patient to monitor blood sugars at least daily, reviewed current Hgb A1C with daughter, discussed healthier meal options, instructed and encouraged daughter to request prescription for new CBG meter from Dr. Alyse Low nurse on call 11/15/2017, Living Well with Diabetes Booklet mailed to patient, encouraged patient and daughter to utilize 2019 Calendar booklet to assist with documentation of blood sugar readings     Appointments:  Patient had appointment with Dr. Tobie Poet on 10/27/2017.  Daughter is unsure of next scheduled appointment but plans to call Dr. Alyse Low office to verify next appointment.  Advanced Directives:  Denies having Living Will or [place] in place and would like information to create one.   Consent:  [facility] reviewed and discussed with patient and daughter.  Patient verbally agrees to [place] monthly telephone outreaches.  Plan: RN Health Coach will send patient Living Well with Diabetes Booklet. RN Health Coach will send patient Copy. RN Health Coach will send patient Emergency planning/management officer. RN Health Coach encouraged patient and daughter to utilize 2019 Calendar Booklet to assist with documentation of CBG readings. RN Health Coach will send primary care provider Barrier's Letter. RN Health Coach will route primary care provider initial telephone assessment. RN Health Coach will make next monthly telephone outreach to patient in the month of March.   [place] [phone] [email or web address]

## 2017-11-16 ENCOUNTER — Telehealth: Payer: Self-pay | Admitting: Pharmacist

## 2017-11-16 ENCOUNTER — Ambulatory Visit: Payer: Self-pay | Admitting: Pharmacist

## 2017-11-16 NOTE — Patient Outreach (Signed)
[facility]) Care Management  11/16/2017  ARISHA GERVAIS [birth year] [id number]   77 y.o. year old female referred to [facility] for Medication Management and Medication Adherence (Pharmacy telephone follow up)   Patient confirms identity with HIPAA-identifiers x2 and gives verbal consent to speak over the phone about medications.   Patient reports she went to the ED and got medication for the flu. Asked patient if she had heard from Dr. Tobie Poet yet regarding medication clarification, prescriptions, and glucometer. Patient states she thinks her daughter, [name], knew something about it and requests I call her.   Called patient's daughter. No answer, left message.   Will follow up Friday.   Carlean Jews, Pharm.D., BCPS PGY2 Ambulatory Care Pharmacy Resident Phone: [phone]

## 2017-11-18 ENCOUNTER — Other Ambulatory Visit: Payer: Self-pay | Admitting: *Deleted

## 2017-11-18 ENCOUNTER — Ambulatory Visit: Payer: Self-pay | Admitting: Pharmacist

## 2017-11-18 ENCOUNTER — Telehealth: Payer: Self-pay | Admitting: Pharmacist

## 2017-11-18 NOTE — Patient Outreach (Signed)
[facility]) Care Management  11/18/2017  [name] Jan 29, 1941 [id number]  77 y.o. year old female referred to [facility] for Medication Adherence and Medication Management (Pharmacy telephone follow up)  PMH s/f: h/o stroke with residual deficits (memory loss per patient and daughter), diabetes typ 2, HTN, carotid artery stenosis, normocytic anemia, depression, GERD  Patient with [employer] medicare advantage plan.   Called patient's daughter, [name], no answer. Left HIPAA-compliant VM.  Called Dr. Alyse Low office as I have not received communication from them yet after I left a message last week. Spoke with [name] regarding concerns from last week. [name] states she will speak with Dr. Tobie Poet as soon as possible and return my call.   Will follow up by next week.  Carlean Jews, Pharm.D., BCPS PGY2 Ambulatory Care Pharmacy Resident Phone: [phone]

## 2017-11-18 NOTE — Patient Outreach (Signed)
[facility]) Care Management  11/18/2017  CAROLEE CHANNELL 08/31/1941 [id number]   CSW has been unable to reach patient by phone after 3 or more phone outreach attempts and letter sent by mail.  HIPPA compliant voice messages were left with no response/return call as well. CSW will plan to close CSW referral at this time due to unsuccessful outreach.  CSW also will notify PCP and [facility] team of above.  Please re-consult if needs and/or interest by patient arises.    Eduard Clos, MSW, [place] Worker  [place] [phone]

## 2017-11-20 NOTE — Patient Outreach (Signed)
[facility]) Care Management  11/20/2017  [name] Jun 16, 1941 [id number]   Charting late:   Dr. Tobie Poet returns phone call to advise the following:   1. Add zantac 150 mg daily 2. Sending new Rx for glucometer and testing supplies 3. Continue mirtazapine, d/c vortioxetine, continue scheduled venlafaxine 4. Sending new Rx for albuterol inhaler 5. Sending new Rx for hydrocodone/acetaminophen 6. OK to continue on nalbumetone PRN  Dr. Tobie Poet states she will have someone from their office call patient to inform. Will follow up with patient next week.  Carlean Jews, Pharm.D., BCPS PGY2 Ambulatory Care Pharmacy Resident Phone: [phone]

## 2017-11-22 ENCOUNTER — Other Ambulatory Visit: Payer: Self-pay | Admitting: *Deleted

## 2017-11-23 ENCOUNTER — Ambulatory Visit: Payer: Self-pay | Admitting: *Deleted

## 2017-11-23 DIAGNOSIS — E1142 Type 2 diabetes mellitus with diabetic polyneuropathy: Secondary | ICD-10-CM | POA: Diagnosis not present

## 2017-11-23 DIAGNOSIS — K219 Gastro-esophageal reflux disease without esophagitis: Secondary | ICD-10-CM | POA: Diagnosis not present

## 2017-11-23 DIAGNOSIS — F321 Major depressive disorder, single episode, moderate: Secondary | ICD-10-CM | POA: Diagnosis not present

## 2017-11-24 ENCOUNTER — Other Ambulatory Visit: Payer: Self-pay | Admitting: *Deleted

## 2017-11-24 NOTE — Patient Outreach (Signed)
[place] [facility]) Care Management  11/24/2017  DEVEN AUDI 1940-12-16 [id number]   CSW continues to be unable to make contact with patient and family- CSW has mailed Advance Directive packets to her in case she does wish to complete.   CSW has updated the [facility] team as well.    Eduard Clos, MSW, [place] Worker  [place] [phone]

## 2017-11-25 ENCOUNTER — Telehealth: Payer: Self-pay | Admitting: Pharmacist

## 2017-11-25 ENCOUNTER — Ambulatory Visit: Payer: Self-pay | Admitting: Pharmacist

## 2017-11-25 NOTE — Patient Outreach (Signed)
[place] [facility]) Care Management  11/25/2017  [name] September 15, 1941 [id number]   77 y.o. year old female referred to [place] for Medication Management and Medication Adherence (Pharmacy telephone f/u)  Patient reports she is feeling better but she's still "wiped out". States that she got the new prescriptions from Dr. Tobie Poet but she has not been taking meds out of the pill box because they fell on the floor and got mixed up. Requests I come out to fill again. Reports she also got new glucometer. Agrees to home visit next Wednesday at [place] visit scheduled, requested that patient have all meds refilled and ready so we can fill box for her.   Carlean Jews, Pharm.D., BCPS PGY2 Ambulatory Care Pharmacy Resident Phone: [phone]

## 2017-11-30 ENCOUNTER — Other Ambulatory Visit: Payer: Self-pay | Admitting: Pharmacist

## 2017-11-30 NOTE — Patient Outreach (Signed)
[name] Care Management  [place]   11/30/2017  [name] [birth year] [id number]  Subjective: "I don't know where my medicine is" "I take my medicines off and on" "I dropped my pill box and it got all messed up"  She reports that her son has moved out of the home and her daughter comes by infrequently. Reports that she had the flu last week and is slowly feeling better. States that she picked up medications from the pharmacy last week but cannot most of her old meds. Reports improvement in reflux symptoms requiring rolaids 3 times daily now. Reports she has an old nebulizer to use albuterol with. Denies chest pain, falls, dizziness, syncope since last home visit. Gives permission to go through pill cabinets and clean out old medications. Per patient, has not heard from low income subsidy/extra help yet.   Called [name] (daughter) with patient's permission during the visit [phone]), she states that she was unable to accompany her mom during the visit as she was busy. She reports she does not know what happened to her mom's medications but she will call me when she finds them.   Objective:  Blood glucose = 159 fasting today Blood pressure =  143/89  Pill box inspection included in each cell: Aspirin 81 mg Venlafaxine Er 75 mg Esomeprazole 40 mg Fenofibrate 200 mg Atorvastatin 80 mg Metformin 500 mg Clopidogrel 75 mg  Also has mirtazapine and hydrocodone, esomeprazole, atorvastatin, venlafaxine, B12, aspirin bottles  Missing: Zantac, fenofibrate, clopidogrel, metformin, nabumetone  Called [facility]: Clopidogrel - Not on profile Fenofibrate - last filled 10/09/17 - 90 day supply Metformin - Not on profile Atorvastatin - last filled 10/09/2017 - 90 day supply Ranitidine - 150 mg daily  11/23/2017 - 30 day supply Mirtazapine 30 mg qhS 11/23/17 - 90 day supply Nexium 40 mg daily 11/23/17 - 90 day supply Norco 5-325 11/23/17  Encounter  Medications: Outpatient Encounter Medications as of 11/30/2017  Medication Sig Note  . albuterol (PROVENTIL HFA;VENTOLIN HFA) 108 (90 Base) MCG/ACT inhaler Inhale 1-2 puffs into the lungs daily as needed for wheezing or shortness of breath.   Marland Kitchen aspirin EC 81 MG tablet Take 81 mg by mouth every morning.  06/06/2016: Pt does not remember what time  . atorvastatin (LIPITOR) 80 MG tablet Take 80 mg by mouth daily.     . Ca Carbonate-Mag Hydroxide (ROLAIDS) 550-110 MG CHEW Chew 2 tablets by mouth daily as needed.    . clopidogrel (PLAVIX) 75 MG tablet Take 75 mg by mouth daily.     Marland Kitchen esomeprazole (NEXIUM) 40 MG capsule Take 40 mg by mouth daily before breakfast.     . fenofibrate micronized (LOFIBRA) 200 MG capsule Take 200 mg by mouth daily before breakfast.     . HYDROcodone-acetaminophen (NORCO/VICODIN) 5-325 MG per tablet Take 1 tablet by mouth 2 (two) times daily as needed for moderate pain. Hip and Shoulder pain    . metFORMIN (GLUCOPHAGE) 500 MG tablet Take 500 mg by mouth daily.   . mirtazapine (REMERON) 30 MG tablet Take 30 mg by mouth at bedtime.   . Multiple Vitamin (MULTIVITAMIN) capsule Take 1 capsule by mouth daily.   . nabumetone (RELAFEN) 500 MG tablet Take 500 mg by mouth daily as needed. 11/14/2017: Not taking per Daughter   . venlafaxine XR (EFFEXOR-XR) 75 MG 24 hr capsule Take 75 mg by mouth daily with breakfast.   . vitamin B-12 (CYANOCOBALAMIN) 250 MCG tablet Take 1 tablet (  250 mcg total) by mouth daily.    No facility-administered encounter medications on file as of 11/30/2017.     Functional Status: In your present state of health, do you have any difficulty performing the following activities: 11/14/2017 11/09/2017  Hearing? N N  Vision? Y Y  Difficulty concentrating or making decisions? Tempie Donning  Walking or climbing stairs? N N  Dressing or bathing? N N  Doing errands, shopping? Tempie Donning  Preparing Food and eating ? N -  Using the Toilet? N -  In the past six months, have you  accidently leaked urine? Y -  Do you have problems with loss of bowel control? N -  Managing your Medications? Y -  Managing your Finances? Y -  Housekeeping or managing your Housekeeping? Y -  Some recent data might be hidden    Fall/Depression Screening: Fall Risk  11/14/2017 11/08/2017  Falls in the past year? Yes Yes  Number falls in past yr: 2 or more 2 or more  Injury with Fall? No No  Risk Factor Category  High Fall Risk -  Risk for fall due to : History of fall(s);Impaired balance/gait;Impaired mobility -  Follow up Education provided;Falls prevention discussed Education provided;Falls prevention discussed   PHQ 2/9 Scores 11/14/2017 11/08/2017 10/31/2017  PHQ - 2 Score 3 3 4   PHQ- 9 Score 10 10 12    Drugs sorted by system:  Neurologic/Psychologic: plavix, aspirin, venlafaxine, mirtazapine  Cardiovascular: atorvastatin, fenofibrate  Gastrointestinal: esomeprazole, ranitidine (does not have) rolaids as needed  Endocrine:metformin  Pain:hydrocodone-acetaminophen, nalbumetone   Vitamins/Minerals:b12 (not currently taking), MVI  Duplications in therapy: none Medications to avoid in the elderly: PPI, narcotics  Drug interactions: none clinically significant  Other issues noted: lost supply of zantac, fenofibrate; needs new Rx for metformin and clopidogrel  Plan:  - Identified pills and filled 1 complete week of medications in pill box except zantac as this was not available.  -Marked medications that were expired and observed patient disposing of medications. Organized medications so that active, daily meds were all in one place.  -Programmed new glucometer and taught patient how to check blood glucose -Took blood pressure and educated on dietary modification for blood pressure and glycemic control -Educated on signs/symptoms/treatment of hypoglycemia -Sent barriers letter to Dr. Tobie Poet  Will follow up with patient next week at the minimum.   Carlean Jews,  Pharm.D., BCPS PGY2 Ambulatory Care Pharmacy Resident Phone: [phone]

## 2017-12-05 ENCOUNTER — Other Ambulatory Visit: Payer: Self-pay | Admitting: *Deleted

## 2017-12-05 ENCOUNTER — Encounter: Payer: Self-pay | Admitting: *Deleted

## 2017-12-05 NOTE — Patient Outreach (Signed)
[facility]) Care Management  12/05/2017  [name] May 23, 1941 [id number]   [place] Monthly Outreach  Referral Date:  10/31/2017 Referral Source:  MD Referral Reason for Referral:  "Medication Management" Insurance:  [employer]   Outreach Attempt:  Successful telephone outreach to patient and daughter for monthly follow up.  HIPAA verified with patient.  Patient stating she feels nervous and upset and requesting RN health Coach speak with her daughter.  Spoke with daughter.  Stating patient became upset thinking she lost her check book, which since has been found.  Reports patient now living home alone, son and grand kids have moved out.  Daughter reporting patient has been compliant with medications since home visit from [place] last week.  Denies any issues with losing medications or mixing medication in the pill box.  Patient reports taking blood sugars daily, although she has not taking fasting blood sugar this morning.  States she does not remember what her blood sugar ranges have been, just "they have not been high".  Requested patient record blood sugar readings.  Denies any recent falls since our last conversation.  Appointments:  Patient and daughter stating she has seen Dr. Tobie Poet a few weeks back, unsure of the date.  They both are unsure of next scheduled follow up appointment with Dr. Tobie Poet.  Encouraged them to contact Dr. Alyse Low office for scheduled appointment.  Plan:  RN Health Coach will make next monthly outreach to patient in the month of April.   [place] [phone] [name]

## 2017-12-07 ENCOUNTER — Other Ambulatory Visit: Payer: Self-pay | Admitting: Pharmacist

## 2017-12-07 NOTE — Patient Outreach (Signed)
[place] [facility]) Care Management  12/07/2017  [name] 1941/08/06 [id number]   77 y.o. year old female referred to [place] for Medication Management (Pharmacy telephone f/u) S/O Attempted to contact patient, no answer. Called patient's daughter who answers and states that they were able to pick up metformin, have not gotten prescription for clopidogrel. She reports they have been unable to find missing medications from last week. Currently missing: zantac, fenofibrate, nabumetone, clopidogrel. Patient's daughter states she is filling her mother's med box with the list I made last week. She reports patient has been checking blood glucose daily and readings are "good" "all in the 100s".   [facility] to confirm - they report that they filled metformin 500 mg daily 12/01/17 -90 day supply. No prescription for clopidogrel received. They report fenofibrate and atorvastatin can be filled on 12/16/2017. Cash price for fenofibrate 200 mg #10 $18.68 (patient has old supply of atorvastatin). Zantac can be filled on 01/30/2018 as this was filled for 90 day supply on 11/23/2017.   A/P:  #Medication adherence limited by patient's misplacement of medications. Eligible for refills on fenofibrate in 9 days. Unclear if patient is supposed to remain on plavix or not.  -Called Dr. Alyse Low office and left message requesting clarification on whether the patient is supposed to be on clopidogrel anymore or not. Will follow up with Dr. Alyse Low office.  -Attempted to call back daughter however no answer. Left HIPAA-compliant VM.  -Will follow up with daughter/patient on Friday.   Carlean Jews, Pharm.D., BCPS PGY2 Ambulatory Care Pharmacy Resident Phone: [phone]

## 2017-12-09 ENCOUNTER — Other Ambulatory Visit: Payer: Self-pay | Admitting: Pharmacist

## 2017-12-09 ENCOUNTER — Telehealth: Payer: Self-pay | Admitting: Pharmacist

## 2017-12-09 NOTE — Patient Outreach (Signed)
[facility]) Care Management  12/09/2017  [name] 01/08/1941 [id number]  77 y.o. year old female referred to [place] for Medication Management and Medication Adherence (Pharmacy telephone f/u)   Patient confirms identity with HIPAA-identifiers x2 and gives verbal consent to speak over the phone about medications.   Patient reports she found her medications and put them all it the cabinet where we had things organized. Informed patient that clopidogrel is waiting for her at [place] however patient states she found old bottle for now. Patient states her daughter is unavailable this weekend but patient is able to take medications appropriately at this time. States she is testing blood glucose regularly. With patient's consent, made home visit appointment Tuesday AM.   Carlean Jews, Pharm.D., BCPS PGY2 Ambulatory Care Pharmacy Resident Phone: [phone]

## 2017-12-13 ENCOUNTER — Other Ambulatory Visit: Payer: Self-pay | Admitting: Pharmacist

## 2017-12-13 NOTE — Patient Outreach (Signed)
[place] [facility]) Care Management  [place]   12/13/2017  [name] April 22, 1941 [id number]  77 y.o. year old female referred to [place] for Medication Adherence and Medication Management (Pharmacy home visit)  SUBJECTIVE:   Medication Adherence: Patient found missing medications but has not gone to the pharmacy to pick up ranitidine or clopidogrel. Has supply of all chronic medications (including old pill bottle of clopidogrel - still in date). Pill box count is off from what I last filled however patient reports her daughter might have filled some of it up. Patient reports she doesn't like to take medications and is afraid taking too many at the same time will "poison" her. Not checking blood glucose on her own meter but states she is checking some on her daughter's meter.    Medication Assistance:   Denies need at this time   Medication Management:   -Depression - states "sometimes I just don't want to live anymore" citing the death of her husband and family stressors/health issues as reasoning. Denies SI/HI several times. -Diabetes - continues to consume high-carbohydrate foods and beverages, not testing blood sugar regularly -H/o falls - denies falls since last visit - GERD - denies use of rolaids as needed for uncontrolled symptoms, reports she is on esomeprazole alone as she did not pick up zantac from pharmacy -Memory loss - at times patient states she took all medications regularly, at times she denies. Reports she misplaces and forgets things often.   Encounter Medications: Outpatient Encounter Medications as of 12/13/2017  Medication Sig Note  . albuterol (PROVENTIL HFA;VENTOLIN HFA) 108 (90 Base) MCG/ACT inhaler Inhale 1-2 puffs into the lungs daily as needed for wheezing or shortness of breath.   Marland Kitchen aspirin EC 81 MG tablet Take 81 mg by mouth every morning.    Marland Kitchen atorvastatin (LIPITOR) 80 MG tablet Take 80 mg by mouth daily.     . Ca  Carbonate-Mag Hydroxide (ROLAIDS) 550-110 MG CHEW Chew 2 tablets by mouth daily as needed.    . clopidogrel (PLAVIX) 75 MG tablet Take 75 mg by mouth daily.     Marland Kitchen esomeprazole (NEXIUM) 40 MG capsule Take 40 mg by mouth daily before breakfast.     . fenofibrate micronized (LOFIBRA) 200 MG capsule Take 200 mg by mouth daily before breakfast.     . HYDROcodone-acetaminophen (NORCO/VICODIN) 5-325 MG per tablet Take 1 tablet by mouth 2 (two) times daily as needed for moderate pain. Hip and Shoulder pain    . metFORMIN (GLUCOPHAGE) 500 MG tablet Take 500 mg by mouth daily.   . mirtazapine (REMERON) 30 MG tablet Take 30 mg by mouth at bedtime.   . Multiple Vitamin (MULTIVITAMIN) capsule Take 1 capsule by mouth daily.   . nabumetone (RELAFEN) 500 MG tablet Take 500 mg by mouth daily as needed. 11/14/2017: Not taking per Daughter   . venlafaxine XR (EFFEXOR-XR) 75 MG 24 hr capsule Take 75 mg by mouth daily with breakfast.   . vitamin B-12 (CYANOCOBALAMIN) 250 MCG tablet Take 1 tablet (250 mcg total) by mouth daily. (Patient not taking: Reported on 11/30/2017)    No facility-administered encounter medications on file as of 12/13/2017.     Functional Status: In your present state of health, do you have any difficulty performing the following activities: 11/14/2017 11/09/2017  Hearing? N N  Vision? Y Y  Difficulty concentrating or making decisions? Tempie Donning  Walking or climbing stairs? N N  Dressing or bathing? N N  Doing  errands, shopping? Tempie Donning  Preparing Food and eating ? N -  Using the Toilet? N -  In the past six months, have you accidently leaked urine? Y -  Do you have problems with loss of bowel control? N -  Managing your Medications? Y -  Managing your Finances? Y -  Housekeeping or managing your Housekeeping? Y -  Some recent data might be hidden    Fall/Depression Screening: Fall Risk  12/05/2017 11/14/2017 11/08/2017  Falls in the past year? (No Data) Yes Yes  Comment denies falls since last  conversation - -  Number falls in past yr: - 2 or more 2 or more  Injury with Fall? - No No  Risk Factor Category  - High Fall Risk -  Risk for fall due to : - History of fall(s);Impaired balance/gait;Impaired mobility -  Follow up - Education provided;Falls prevention discussed Education provided;Falls prevention discussed   PHQ 2/9 Scores 11/14/2017 11/08/2017 10/31/2017  PHQ - 2 Score 3 3 4   PHQ- 9 Score 10 10 12     ASSESSMENT/PLAN:  #Medication management - no side effects noted from medications. Decreased use of rolaids on esomeprazole alone indicative of controlled GERD sx without need for add-on ranitidine. Diabetes poorly managed in setting of medication noncompliance. Depression, moderate, still uncontrolled given most recent PHQ9 score and report of lack of will to live on occasion in setting of medication noncompliance. Overall success with disease state self management is very limited by patient's willingness to take medications daily, citing "I just don't like medicines" as reason for noncompliance - also suspect degree of memory loss.  -Counseled on indication and importance of taking each medication -Taught patient how to use glucometer again, asked pt to test blood sugar several times weekly before food in the mornings -Discussed need for rantidine and planned to use as needed and discuss with Dr. Tobie Poet at next appointment -Discussed referral to social worker to discuss mental health resources/counseling. Patient declines at present, wants to think about it. Will follow up with this referral next week  #Medication adherence - continues to be questionably adherent, adherence limited by lack of motivation to take medications.  -Patient declines social work and nursing involvement at this time. - Declines reminders on cell phone or alarm clock. Discussed leaving medications in the kitchen where she can see them daily and decided on this.  -Filled pill box for two weeks with full supply  of medications -Reminded patient to pick up new supply of clopidogrel from pharmacy  Attempted to contact daughter, [name], who is intimately involved in patient's care to give an update however she did not answer. Left HIPAA-compliant VM requesting she return my call.   Will follow up with patient next week. Will offer nursing and social work consults again at that time.   Carlean Jews, Pharm.D., BCPS PGY2 Ambulatory Care Pharmacy Resident Phone: [phone]

## 2017-12-20 ENCOUNTER — Telehealth: Payer: Self-pay | Admitting: Pharmacist

## 2017-12-20 ENCOUNTER — Ambulatory Visit: Payer: Self-pay | Admitting: Pharmacist

## 2017-12-20 NOTE — Patient Outreach (Signed)
[name]) Care Management  12/20/2017  [name] 29-Jun-1941 [id number]  77 y.o. year old female referred to [place] for Medication Management and Medication Adherence (Pharmacy telephone outreach)    2:21PM Called patient's daughter first, she reports that patient has all of her medications but is continuing to inconsistently take them. Daughter confirms that patient has told her "I just don't like medication" and will often "fight" daughter when daughter encourages adherence. Daughter agrees that social work and nursing consults from [facility] would be beneficial to patient. Daughter also agrees to continue filling pill box using list I provided.   -------------  2:30 PM Called patient.  Patient confirms identity with HIPAA-identifiers x2 and gives verbal consent to speak over the phone about medications.   Patient reports she is feeling "down" today but overall is feeling much better. Patient reports her blood glucose was 70 yesterday when she checked right before breakfast, generally runs in the low 100s. Patient reports that she has taken medications nearly every day this week. Patient reports she picked up clopidogrel and ranitidine from the pharmacy. Patient agrees to social work and nursing consults but states "I don't know about that" when I mentioned advanced directives. Patient denies further pharmacy need at this time.    A/P:  Patient with improved medication adherence per patient report, questionable with daughter's report. Success with medication adherence limited by patient willingness to take medication. She has strong support from her daughter, [name], who will fill pill boxes with the list I provided at previous visit. No further needs from pharmacy identified.  -Counseled on importance of medication adherence and short/longterm consequences of nonadherence -Consult placed for [facility] CM Nursing and Social work -Pharmacy to sign off, provided my number to  patient and daughter should needs arise.   Carlean Jews, Pharm.D., BCPS PGY2 Ambulatory Care Pharmacy Resident Phone: [phone]

## 2017-12-22 ENCOUNTER — Other Ambulatory Visit: Payer: Self-pay

## 2017-12-22 NOTE — Patient Outreach (Signed)
New referral: Placed call to patient for referral for assessment of needs.   Called and patient and explained reason for call. Patient initially stated that she was doing well and did not need a nurse. Reviewed concerns for DM list by pharmacy and patient reports she has good support from her daughter.   At the end of the call patient states that she would accept a home visit to determine needs.    PLAN: home visit on 12/29/2017 at 10:30. Confirmed address. Provided my contact information for patient in case of need prior to home visit.  Tomasa Rand, RN, BSN, CEN [facility] [employer] [phone]

## 2017-12-27 ENCOUNTER — Other Ambulatory Visit: Payer: Self-pay | Admitting: *Deleted

## 2017-12-27 NOTE — Patient Outreach (Signed)
[place] [facility]) Care Management  12/27/2017  [name] November 04, 1940 [id number]   Request received from Eduard Clos to mail patient an Advanced Directive packet. Information mailed today.\  Daneen Schick, BSW, CDP Social Worker [place] Mailing Address: [address], [place], [place] [address] Physical Address: [address], [place], [place] [address] Toll Free Main # [phone] Fax # [phone] Cell # [phone] Fax # [phone] [email or web address]      Humana  Discrimination is Against the [employer]. and its subsidiaries comply with applicable Federal civil rights laws and do not discriminate on the basis of race, color, national origin, age, disability, or sex. [place] do not exclude people or treat them differently because of race, color, national origin, age, disability, or sex.    [employer]. and its subsidiaries provide:  . Free auxiliary aids and services, such as qualified sign language interpreters, video remote interpretation, and written information in other formats to people with disabilities when such auxiliary aids and services are necessary to ensure an equal opportunity to participate. . Free language services to people whose primary language is not English when those services are necessary to provide meaningful access, such as translated documents or oral interpretation.    If you need these services, call [phone] or if you use a TTY, call 711.   If you believe that Yahoo. and its subsidiaries have failed to provide these services or discriminated in another way on the basis of race, color, national origin, age, disability, or sex, you can file a Tourist information centre manager with:   Discrimination [place] [address]   If you need help filing a grievance, call [phone] or if you use a TTY, call 711.   You can also  file a civil rights complaint with the U.S. Department of Health and Financial controller, Office for Civil Rights electronically through the Office for Civil Rights Complaint Portal, available at OnSiteLending.nl.jsf, or by mail or phone at:   [place]. Department of Health and Human Services  [place], [place], [facility]  [place], [place]  [id number], [phone] (TDD)  Complaint forms are available at CutFunds.si [place]: ATTENTION: If you do not speak English, language assistance services, free of charge, are available to you. Call [phone] (TTY: 625).  Espaol (Spanish): ATENCIN: si habla espaol, tiene a su disposicin servicios gratuitos de asistencia lingstica. Llame al [phone]     (TTY: 681). ???? (Chinese): ?????????????????????????????? [phone]?TTY: 711??  Ti?ng Vi?t (Vietnamese): CH : N?u b?n ni Ti?ng Vi?t, c cc d?ch v? h? tr? ngn ng? mi?n ph dnh cho b?n. G?i s? [phone]                                                                                         (TTY: 680).  ??? ([place]): ?? : ???? ????? ?? , ?? ?? ???? ??? ???? ? ???? . [phone] (TTY: 711)??? ??? ???? .  Tagalog ([place]): PAUNAWA: Kung nagsasalita ka ng Tagalog, maaari kang gumamit ng mga  serbisyo ng tulong sa wika nang walang bayad. [place] sa [phone]                                                                                                                       (TTY: 662).  ??????? [place]): ????????: ???? ?? ???????? ?? ??????? ?????, ?? ??? ???????? ?????????? ?????? ????????. ??????? [phone]                                                                                                      (????????: 711).  Kreyl Ayisyen ([place]): ATANSYON: Si w pale Ethelene Hal, gen svis d pou lang ki disponib  gratis pou ou. Rele [phone]           (TTY: 001).  Fonnie Jarvis Marland Kitchen[place]): ATTENTION : Si vous parlez franais, des services d'aide linguistique vous sont proposs gratuitement. Appelez le [phone]                                                                                              (ATS : 638).  P[place] ([place] UWAGA: Je?eli mwisz po polsku, mo?esz skorzysta? z bezp?atnej pomocy j?zykowej. Zadzwo? pod numer [phone]       (TTY: 779).  Portugus ([place]): ATENO: Se fala portugus, encontram-se disponveis servios lingusticos, grtis. Ligue para [phone]      (TTY: 076).  Italiano ([place]): ATTENZIONE: In caso la lingua parlata sia l'italiano, sono disponibili servizi di assistenza linguistica gratuiti. [place]iam[place] il numero [phone]                                                                                                                            (TTY: 563).  Dawayne Patricia ([place]): ACHTUNG: Wenn Sie Deutsch sprechen, stehen Ihnen kostenlos sprachliche Hilfsdienstleistungen zur [employer]. [place]: [phone]                                                                               (TTY: 094).   (Arabic): [phone]   .            : .)159 :   (  ??? ([place]): ??????????????????????????????????[phone] ?TTY?711?????????????????  ? (Farsi): [phone]  . ?   ? ?  ? ? ?~ ?  ?    : .??  (TTY: 711)  Din Bizaad ([place]): D77 baa ak0 n7n7zin: D77 saad bee y1n7[ti'go Risa Grill, saad bee 1k1'1n7da'1wo'd66', t'11 Pricilla Loveless n1 h0l=, koj8' h0d77lnih [phone]            (TTY:   291).

## 2017-12-27 NOTE — Patient Outreach (Signed)
[place] [facility]) Care Management  12/27/2017  [name] Feb 08, 1941 [id number]   CSW was able to make initial contact with patient today by phone. Identity was confirmed and CSW introduced self, explained role and types of services provided through [place] Management ([place] Management).  CSW advised pt that the reason for CSW call was to assess and assist with possible needs related to mental health counseling and Advance Directives.  Patient reacted unknowingly aware of these needs and so CSW discussed them with her. CSW explained the Advance Directive forms and she is interested in receiving these.  CSW offered to mail the packet to her to which she agreed.  CSW also discussed the mental health referral to which she denies any needs. She shared with CSW the loss of her husband last year; "we got married when I was 39".  She has happy memories but is still grieving the loss.  CSW offered to talk with her more about this as well as to link her with community support but she declines at this time.  She repeatedly said she had suffered from depression and "almost lost it" in the past due to the loss of her husband.  "my kids are around and they help me get through".  CSW completed PHQ9 Depression screening and discussed possible supportive services, options for her but she again denies it being an issue now.  CSW will plan to mail the Advance Directive packet to her and follow up with a phone call in the next 2 weeks to confirm she received and to screen again for mental health needs/interest.  CSW will update [facility] team of above.   Eduard Clos, MSW, [place] Worker  [place] [phone]

## 2017-12-28 ENCOUNTER — Other Ambulatory Visit: Payer: Self-pay | Admitting: *Deleted

## 2017-12-28 NOTE — Patient Outreach (Signed)
[place] [facility]) Care Management  12/28/2017  [name] June 21, 1941 [id number]   [place] Monthly Outreach  Referral Date:  10/31/2017 Referral Source:  MD Referral Reason for Referral:  "Medication Management" Insurance:  Humana Medicare   Outreach Attempt:  Received notification that [name] placed referral for [place] Nurse to help with medication compliance.  Patient has verbally agreed to home visit with [place].  RN Health Coach spoke with [facility] RN and confirmed home visit appointment.  Plan:  RN Health Coach will perform a program closure.  RN Health Coach will send primary care provider a Discipline Closure Letter.  [place] [phone] [email or web address]

## 2017-12-29 ENCOUNTER — Other Ambulatory Visit: Payer: Self-pay

## 2017-12-29 DIAGNOSIS — H40023 Open angle with borderline findings, high risk, bilateral: Secondary | ICD-10-CM | POA: Diagnosis not present

## 2017-12-29 DIAGNOSIS — H353132 Nonexudative age-related macular degeneration, bilateral, intermediate dry stage: Secondary | ICD-10-CM | POA: Diagnosis not present

## 2017-12-29 DIAGNOSIS — E119 Type 2 diabetes mellitus without complications: Secondary | ICD-10-CM | POA: Diagnosis not present

## 2017-12-29 DIAGNOSIS — Z961 Presence of intraocular lens: Secondary | ICD-10-CM | POA: Diagnosis not present

## 2017-12-29 DIAGNOSIS — H5211 Myopia, right eye: Secondary | ICD-10-CM | POA: Diagnosis not present

## 2017-12-29 NOTE — Patient Outreach (Signed)
[place] [facility]) Care Management   12/29/2017  [name] 06-27-1941 [id number]  [name] is an 77 y.o. female 10:30 am arrived for home visit. No answer at back door or front door. Patient walked to the front door while I was standing there and ask what I needed.  I reminded patient we had an appointment. She stated she did not remember.  Subjective:  Patient reports that she has forgetfulness. Reports she lives alone and her daughter visit almost daily. Patient reports that she is doing much better at taking her medications as prescribed.  Reports her biggest concern is her memory. She reports that she has not ever gotten lost when driving and reports that she has never forgotten she was cooking.  Reports she lives alone since her husband died last year. Reports memory is worse since husband died. Patient unable to locate her CBG meter and does not know her recent labs or pending appointments.  Objective:  Home is very cluttered. 2 dogs. Patient confused to where her CBG is.   Today's Vitals   12/29/17 1100 12/29/17 1103  BP: 136/64   Pulse: 84   Resp: 16   SpO2: 98%   Weight: 117 lb (53.1 kg)   Height: 1.575 m (5\' 2" )   PainSc:  0-No pain   Review of Systems  Constitutional: Negative.   HENT: Negative.   Eyes:       Going to get glasses today  Respiratory: Negative.   Cardiovascular: Negative.   Gastrointestinal: Positive for constipation.  Genitourinary: Negative.   Musculoskeletal: Negative.   Skin:       Left buttock bug bite per patient.  Neurological:       Reports left sides arm weakness from stroke 1 year ago  Endo/Heme/Allergies: Bruises/bleeds easily.  Psychiatric/Behavioral: Positive for depression. The patient is nervous/anxious.     Physical Exam  Constitutional: She appears well-developed and well-nourished.  Cardiovascular: Normal rate, regular rhythm and normal heart sounds.  Respiratory: Effort normal and breath sounds normal.  GI:  Soft. Bowel sounds are normal.  Musculoskeletal: Normal range of motion. She exhibits no edema.  Neurological: She is alert.  2018, [place], unable to verbalize month, unable to verbalize day of the week.   Skin: Skin is warm and dry.  Feet without lesions or open skin.  Small red bump to the left buttock. No signs of infection. Appears to be scabbing over.   Psychiatric: She has a normal mood and affect. Her behavior is normal. Judgment normal.  Unable to remember daughter phone number,  Cant find CBG machine    Encounter Medications:   Outpatient Encounter Medications as of 12/29/2017  Medication Sig Note  . albuterol (PROVENTIL HFA;VENTOLIN HFA) 108 (90 Base) MCG/ACT inhaler Inhale 1-2 puffs into the lungs daily as needed for wheezing or shortness of breath.   Marland Kitchen aspirin EC 81 MG tablet Take 81 mg by mouth every morning.    Marland Kitchen atorvastatin (LIPITOR) 80 MG tablet Take 80 mg by mouth daily.     . Ca Carbonate-Mag Hydroxide (ROLAIDS) 550-110 MG CHEW Chew 2 tablets by mouth daily as needed.    . clopidogrel (PLAVIX) 75 MG tablet Take 75 mg by mouth daily.     Marland Kitchen esomeprazole (NEXIUM) 40 MG capsule Take 40 mg by mouth daily before breakfast.     . fenofibrate micronized (LOFIBRA) 200 MG capsule Take 200 mg by mouth daily before breakfast.     . HYDROcodone-acetaminophen (NORCO/VICODIN) 5-325 MG  per tablet Take 1 tablet by mouth 2 (two) times daily as needed for moderate pain. Hip and Shoulder pain    . metFORMIN (GLUCOPHAGE) 500 MG tablet Take 500 mg by mouth daily.   . mirtazapine (REMERON) 30 MG tablet Take 30 mg by mouth at bedtime.   . Multiple Vitamin (MULTIVITAMIN) capsule Take 1 capsule by mouth daily.   Marland Kitchen venlafaxine XR (EFFEXOR-XR) 75 MG 24 hr capsule Take 75 mg by mouth daily with breakfast.   . vitamin B-12 (CYANOCOBALAMIN) 250 MCG tablet Take 1 tablet (250 mcg total) by mouth daily.   . nabumetone (RELAFEN) 500 MG tablet Take 500 mg by mouth daily as needed. 11/14/2017: Not taking per  Daughter    No facility-administered encounter medications on file as of 12/29/2017.     Functional Status:   In your present state of health, do you have any difficulty performing the following activities: 12/29/2017 11/14/2017  Hearing? N N  Vision? Y Y  Comment wear glasses -  Difficulty concentrating or making decisions? Tempie Donning  Walking or climbing stairs? N N  Dressing or bathing? N N  Doing errands, shopping? N Y  Conservation officer, nature and eating ? N N  Using the Toilet? N N  In the past six months, have you accidently leaked urine? N Y  Do you have problems with loss of bowel control? N N  Managing your Medications? Tempie Donning  Comment daughter fills a pill planner -  Managing your Finances? N Y  Housekeeping or managing your Housekeeping? Tempie Donning  Comment home is cluttered -  Some recent data might be hidden    Fall/Depression Screening:    Fall Risk  12/29/2017 12/05/2017 11/14/2017  Falls in the past year? Yes (No Data) Yes  Comment - denies falls since last conversation -  Number falls in past yr: 1 - 2 or more  Injury with Fall? No - No  Risk Factor Category  - - High Fall Risk  Risk for fall due to : - - History of fall(s);Impaired balance/gait;Impaired mobility  Follow up Falls evaluation completed - Education provided;Falls prevention discussed   PHQ 2/9 Scores 12/29/2017 12/27/2017 11/14/2017 11/08/2017 10/31/2017  PHQ - 2 Score 6 2 3 3 4   PHQ- 9 Score 13 6 10 10 12     Assessment:   (1) Reviewed [facility] program.  Reviewed consent. Provided a new patient packet.  Provided [facility] calendar and my contact card.  (2)postive depression screening. (3) unable to locate CBG machine. (4) concern for memory loss. (5) eye appointment today.  (6) daughter manages medications.   Plan:  (1) reviewed consent in chart. Patient declined any changes. (2) Encouraged patient to talk to MD at next appointment. Encouraged patient to take her medications as prescribed. Provided support and a listening eye to  grief. (3) Encouraged patient to monitor CBG daily and record in [facility] calendar. Encouraged patient to take her reading to MD at next office visit. Patient found meter at the end of my visit. It was in the kitchen. (4) Reviewed concern for memory loss with patient. Will send note to MD. Wrote next appointments down with time and date for patient.  (5) reviewed with patient pending appointments. (6) Reviewed with patient to take all medications as prescribed.   According to [place] at [facility] practice phone call during home visit.  Next office visit with primary MD on 01/26/2018 at 11:!5 fasting.  Last office visit on March 6th   Last A1c of  7.0 on 10/27/2017   [place] letter and this note send to MD. Plan to contact patient in 1 month for follow up.  THN CM Care Plan Problem One     Most Recent Value  Care Plan Problem One  Knowledge deficit for self care of DM and memory loss  Role Documenting the Problem One  Care Management [place] for Problem One  Active  THN Long Term Goal   Patient will reports improved self management of DM in the next 60 days.  THN Long Term Goal Start Date  12/29/17  Interventions for Problem One Long Term Goal  Reviewed importance of daily monitoring of CBG and taking all medications. Reviewed importance of timely follow up with MD.  Encouraged patient to write things down to help her remember.   THN CM Short Term Goal #1   Patient will attend follow up appointment with MD as planned in the next 30 days. Margie Billet CM Short Term Goal #1 Start Date  12/29/17  Interventions for Short Term Goal #1  Placed call to MD office to determine when follow up appointments were scheduled. Wrote appointment time and dates down for patient.   THN CM Short Term Goal #2   Patient will monitor and record CBG readings for the next 30 days.   THN CM Short Term Goal #2 Start Date  12/29/17  Interventions for Short Term Goal #2  Reviewed with the patient the importance of  daily monitoring and taking log to MD office. Assisted pateint with finding her CBG machine.       Tomasa Rand, RN, BSN, CEN [facility] [employer] [phone]

## 2018-01-02 DIAGNOSIS — L700 Acne vulgaris: Secondary | ICD-10-CM | POA: Diagnosis not present

## 2018-01-03 ENCOUNTER — Other Ambulatory Visit: Payer: Self-pay | Admitting: *Deleted

## 2018-01-03 NOTE — Patient Outreach (Signed)
[facility]) Care Management  01/03/2018  CORIANN BROUHARD 03/28/1941 [id number]   CSW completed a phone follow up with pt today.  She indicated she was in the ER last night and was given some antibiotics. She agrees to [place] home visit next week with her daughter also being there.  Pt had CSW speak with daughter and she has confirmed plans for visit on 01/10/2018.   Eduard Clos, MSW, [place] Worker  [place] [phone]

## 2018-01-09 ENCOUNTER — Ambulatory Visit: Payer: Self-pay | Admitting: *Deleted

## 2018-01-10 ENCOUNTER — Ambulatory Visit: Payer: Self-pay | Admitting: *Deleted

## 2018-01-10 ENCOUNTER — Other Ambulatory Visit: Payer: Self-pay | Admitting: *Deleted

## 2018-01-10 NOTE — Patient Outreach (Signed)
[place] [facility]) Care Management  01/10/2018  [name] May 06, 1941 [id number]   CSW arrived at pt's home as previously arranged with pt and her daughter and was unsuccessful in getting someone to the door. CSW attempted to call pt as well as her daughters # but no answer.  CSW left voicemail message asking for phone follow up.   CSW will advise [facility] RNCM and plan follow up call later this week if no return call is received from pt or daughter.   Eduard Clos, MSW, [place] Worker  [place] [phone]

## 2018-01-13 ENCOUNTER — Other Ambulatory Visit: Payer: Self-pay | Admitting: *Deleted

## 2018-01-13 NOTE — Patient Outreach (Signed)
[place] [facility]) Care Management  01/13/2018  [name] [birth year] [id number]   CSW was unable to reach pt by phone but was able to reach daughter, [name]- identity confirmed. Daughter reports they forgot about CSW home visit planned earlier this week and now states, "can you call me later because I am getting ready to move her next to me". CSW encouraged daughter to call me back when/if needed and CSW will reach out to pt again next week.   Eduard Clos, MSW, [place] Worker  [place] [phone]

## 2018-01-19 ENCOUNTER — Other Ambulatory Visit: Payer: Self-pay | Admitting: *Deleted

## 2018-01-19 ENCOUNTER — Encounter: Payer: Self-pay | Admitting: *Deleted

## 2018-01-19 NOTE — Patient Outreach (Signed)
[place] [facility]) Care Management  01/19/2018  [name] 11-Jul-1941 [id number]   CSW attempted to reach pt again today as planned; she had reported she was moving and to call back when last outreached.   CSW will send unsuccessful outreach letter and try again on 1-3 business days.   Eduard Clos, MSW, [place] Worker  [place] [phone]

## 2018-01-23 ENCOUNTER — Ambulatory Visit: Payer: Self-pay | Admitting: *Deleted

## 2018-01-23 ENCOUNTER — Other Ambulatory Visit: Payer: Self-pay | Admitting: *Deleted

## 2018-01-23 ENCOUNTER — Encounter: Payer: Self-pay | Admitting: *Deleted

## 2018-01-23 NOTE — Patient Outreach (Signed)
[name]) Care Management  01/23/2018  [name] 1940-11-25 [id number]  CSW has been unable to reach pt by phone.  Pt was a no- show to home visit and asked  CSW to call back after seh moved. CSW has continued to call and leave messages and no response. CSW has sent unsuccessful outreach letter and phone outreach attempt made again today without  Success.   CSW will make PCP and [facility] team aware and plan CSW case closure at this time. Eduard Clos, MSW, [place] Worker  [place] [phone]

## 2018-01-30 ENCOUNTER — Ambulatory Visit: Payer: Self-pay

## 2018-01-30 ENCOUNTER — Other Ambulatory Visit: Payer: Self-pay

## 2018-01-30 NOTE — Patient Outreach (Signed)
Telephone assessment:  Follow up call to patient for progress of DM management. No answer on home phone ( voicemail was full). Called cell phone and number was blocked.   PLAN: will mail unsuccessful outreach letter and attempt again in  3 days.  Tomasa Rand, RN, BSN, CEN [facility] [employer] [phone]

## 2018-02-01 DIAGNOSIS — E782 Mixed hyperlipidemia: Secondary | ICD-10-CM | POA: Diagnosis not present

## 2018-02-01 DIAGNOSIS — E1142 Type 2 diabetes mellitus with diabetic polyneuropathy: Secondary | ICD-10-CM | POA: Diagnosis not present

## 2018-02-01 DIAGNOSIS — E1169 Type 2 diabetes mellitus with other specified complication: Secondary | ICD-10-CM | POA: Diagnosis not present

## 2018-02-01 DIAGNOSIS — R413 Other amnesia: Secondary | ICD-10-CM | POA: Diagnosis not present

## 2018-02-01 DIAGNOSIS — F331 Major depressive disorder, recurrent, moderate: Secondary | ICD-10-CM | POA: Diagnosis not present

## 2018-02-01 DIAGNOSIS — I10 Essential (primary) hypertension: Secondary | ICD-10-CM | POA: Diagnosis not present

## 2018-02-01 DIAGNOSIS — Z6821 Body mass index (BMI) 21.0-21.9, adult: Secondary | ICD-10-CM | POA: Diagnosis not present

## 2018-02-02 ENCOUNTER — Other Ambulatory Visit: Payer: Self-pay

## 2018-02-02 NOTE — Patient Outreach (Signed)
Telephone assessment:  2nd attempt to reach patient for follow up unsuccessful.  PLAN: already mailed letter. Will call again in 3 days.Tomasa Rand, RN, BSN, CEN [place] Coordinator [phone]

## 2018-02-08 ENCOUNTER — Other Ambulatory Visit: Payer: Self-pay

## 2018-02-08 NOTE — Patient Outreach (Signed)
Telephone assessment: 3rd unsuccessful phone call to patient.  No response to letter at this time.  PLAN: will wait another 2 days then close case if no response to letter.  Tomasa Rand, RN, BSN, CEN [facility] [employer] [phone]

## 2018-02-10 ENCOUNTER — Other Ambulatory Visit: Payer: Self-pay

## 2018-02-10 NOTE — Patient Outreach (Signed)
Case Closure:  No response to outreach efforts. No response to letter.  PLAN: case closure. Unable to remain contact with patient.  Tomasa Rand, RN, BSN, CEN [facility] [employer] [phone]

## 2018-04-14 DIAGNOSIS — E119 Type 2 diabetes mellitus without complications: Secondary | ICD-10-CM | POA: Diagnosis not present

## 2018-04-14 DIAGNOSIS — Z6823 Body mass index (BMI) 23.0-23.9, adult: Secondary | ICD-10-CM | POA: Diagnosis not present

## 2018-04-14 DIAGNOSIS — E785 Hyperlipidemia, unspecified: Secondary | ICD-10-CM | POA: Diagnosis not present

## 2018-04-14 DIAGNOSIS — I69398 Other sequelae of cerebral infarction: Secondary | ICD-10-CM | POA: Diagnosis not present

## 2018-04-14 DIAGNOSIS — M47819 Spondylosis without myelopathy or radiculopathy, site unspecified: Secondary | ICD-10-CM | POA: Diagnosis not present

## 2018-04-14 DIAGNOSIS — K219 Gastro-esophageal reflux disease without esophagitis: Secondary | ICD-10-CM | POA: Diagnosis not present

## 2018-04-14 DIAGNOSIS — F329 Major depressive disorder, single episode, unspecified: Secondary | ICD-10-CM | POA: Diagnosis not present

## 2018-05-05 DIAGNOSIS — Z6822 Body mass index (BMI) 22.0-22.9, adult: Secondary | ICD-10-CM | POA: Diagnosis not present

## 2018-05-05 DIAGNOSIS — F331 Major depressive disorder, recurrent, moderate: Secondary | ICD-10-CM | POA: Diagnosis not present

## 2018-05-05 DIAGNOSIS — I10 Essential (primary) hypertension: Secondary | ICD-10-CM | POA: Diagnosis not present

## 2018-05-05 DIAGNOSIS — E1169 Type 2 diabetes mellitus with other specified complication: Secondary | ICD-10-CM | POA: Diagnosis not present

## 2018-05-05 DIAGNOSIS — E1142 Type 2 diabetes mellitus with diabetic polyneuropathy: Secondary | ICD-10-CM | POA: Diagnosis not present

## 2018-05-05 DIAGNOSIS — E782 Mixed hyperlipidemia: Secondary | ICD-10-CM | POA: Diagnosis not present

## 2018-06-29 DIAGNOSIS — J168 Pneumonia due to other specified infectious organisms: Secondary | ICD-10-CM | POA: Diagnosis not present

## 2018-06-29 DIAGNOSIS — R0602 Shortness of breath: Secondary | ICD-10-CM | POA: Diagnosis not present

## 2018-06-29 DIAGNOSIS — R05 Cough: Secondary | ICD-10-CM | POA: Diagnosis not present

## 2018-06-29 DIAGNOSIS — J4541 Moderate persistent asthma with (acute) exacerbation: Secondary | ICD-10-CM | POA: Diagnosis not present

## 2018-07-12 DIAGNOSIS — Z1231 Encounter for screening mammogram for malignant neoplasm of breast: Secondary | ICD-10-CM | POA: Diagnosis not present

## 2018-07-12 DIAGNOSIS — Z23 Encounter for immunization: Secondary | ICD-10-CM | POA: Diagnosis not present

## 2018-07-12 DIAGNOSIS — Z1211 Encounter for screening for malignant neoplasm of colon: Secondary | ICD-10-CM | POA: Diagnosis not present

## 2018-07-12 DIAGNOSIS — Z0000 Encounter for general adult medical examination without abnormal findings: Secondary | ICD-10-CM | POA: Diagnosis not present

## 2018-07-12 DIAGNOSIS — N958 Other specified menopausal and perimenopausal disorders: Secondary | ICD-10-CM | POA: Diagnosis not present

## 2018-07-12 DIAGNOSIS — F330 Major depressive disorder, recurrent, mild: Secondary | ICD-10-CM | POA: Diagnosis not present

## 2018-07-19 DIAGNOSIS — I10 Essential (primary) hypertension: Secondary | ICD-10-CM | POA: Diagnosis not present

## 2018-07-19 DIAGNOSIS — E782 Mixed hyperlipidemia: Secondary | ICD-10-CM | POA: Diagnosis not present

## 2018-07-19 DIAGNOSIS — R413 Other amnesia: Secondary | ICD-10-CM | POA: Diagnosis not present

## 2018-07-19 DIAGNOSIS — F331 Major depressive disorder, recurrent, moderate: Secondary | ICD-10-CM | POA: Diagnosis not present

## 2018-07-19 DIAGNOSIS — E1142 Type 2 diabetes mellitus with diabetic polyneuropathy: Secondary | ICD-10-CM | POA: Diagnosis not present

## 2018-07-24 DIAGNOSIS — J4521 Mild intermittent asthma with (acute) exacerbation: Secondary | ICD-10-CM | POA: Diagnosis not present

## 2018-08-09 DIAGNOSIS — R1013 Epigastric pain: Secondary | ICD-10-CM | POA: Diagnosis not present

## 2018-08-09 DIAGNOSIS — J4540 Moderate persistent asthma, uncomplicated: Secondary | ICD-10-CM | POA: Diagnosis not present

## 2018-08-09 DIAGNOSIS — E782 Mixed hyperlipidemia: Secondary | ICD-10-CM | POA: Diagnosis not present

## 2018-08-09 DIAGNOSIS — R0789 Other chest pain: Secondary | ICD-10-CM | POA: Diagnosis not present

## 2018-08-09 DIAGNOSIS — F331 Major depressive disorder, recurrent, moderate: Secondary | ICD-10-CM | POA: Diagnosis not present

## 2018-08-09 DIAGNOSIS — E1142 Type 2 diabetes mellitus with diabetic polyneuropathy: Secondary | ICD-10-CM | POA: Diagnosis not present

## 2018-08-09 DIAGNOSIS — I10 Essential (primary) hypertension: Secondary | ICD-10-CM | POA: Diagnosis not present

## 2018-08-09 DIAGNOSIS — Z6822 Body mass index (BMI) 22.0-22.9, adult: Secondary | ICD-10-CM | POA: Diagnosis not present

## 2018-08-23 DIAGNOSIS — J4521 Mild intermittent asthma with (acute) exacerbation: Secondary | ICD-10-CM | POA: Diagnosis not present

## 2018-09-07 DIAGNOSIS — Z8673 Personal history of transient ischemic attack (TIA), and cerebral infarction without residual deficits: Secondary | ICD-10-CM | POA: Diagnosis not present

## 2018-09-07 DIAGNOSIS — J45998 Other asthma: Secondary | ICD-10-CM | POA: Diagnosis not present

## 2018-09-07 DIAGNOSIS — I10 Essential (primary) hypertension: Secondary | ICD-10-CM | POA: Diagnosis not present

## 2018-09-07 DIAGNOSIS — E782 Mixed hyperlipidemia: Secondary | ICD-10-CM | POA: Diagnosis not present

## 2018-09-07 DIAGNOSIS — K219 Gastro-esophageal reflux disease without esophagitis: Secondary | ICD-10-CM | POA: Diagnosis not present

## 2018-09-07 DIAGNOSIS — E1142 Type 2 diabetes mellitus with diabetic polyneuropathy: Secondary | ICD-10-CM | POA: Diagnosis not present

## 2018-09-19 DIAGNOSIS — E782 Mixed hyperlipidemia: Secondary | ICD-10-CM | POA: Diagnosis not present

## 2018-09-19 DIAGNOSIS — I10 Essential (primary) hypertension: Secondary | ICD-10-CM | POA: Diagnosis not present

## 2018-09-19 DIAGNOSIS — J4540 Moderate persistent asthma, uncomplicated: Secondary | ICD-10-CM | POA: Diagnosis not present

## 2018-09-19 DIAGNOSIS — F320 Major depressive disorder, single episode, mild: Secondary | ICD-10-CM | POA: Diagnosis not present

## 2018-09-19 DIAGNOSIS — E1142 Type 2 diabetes mellitus with diabetic polyneuropathy: Secondary | ICD-10-CM | POA: Diagnosis not present

## 2018-09-19 DIAGNOSIS — I69854 Hemiplegia and hemiparesis following other cerebrovascular disease affecting left non-dominant side: Secondary | ICD-10-CM | POA: Diagnosis not present

## 2018-09-23 DIAGNOSIS — J4521 Mild intermittent asthma with (acute) exacerbation: Secondary | ICD-10-CM | POA: Diagnosis not present

## 2018-10-12 DIAGNOSIS — F331 Major depressive disorder, recurrent, moderate: Secondary | ICD-10-CM | POA: Diagnosis not present

## 2018-10-12 DIAGNOSIS — I69311 Memory deficit following cerebral infarction: Secondary | ICD-10-CM | POA: Diagnosis not present

## 2018-10-12 DIAGNOSIS — I10 Essential (primary) hypertension: Secondary | ICD-10-CM | POA: Diagnosis not present

## 2018-10-12 DIAGNOSIS — E1142 Type 2 diabetes mellitus with diabetic polyneuropathy: Secondary | ICD-10-CM | POA: Diagnosis not present

## 2018-10-12 DIAGNOSIS — E1169 Type 2 diabetes mellitus with other specified complication: Secondary | ICD-10-CM | POA: Diagnosis not present

## 2018-10-12 DIAGNOSIS — K219 Gastro-esophageal reflux disease without esophagitis: Secondary | ICD-10-CM | POA: Diagnosis not present

## 2018-10-12 DIAGNOSIS — J45998 Other asthma: Secondary | ICD-10-CM | POA: Diagnosis not present

## 2018-10-12 DIAGNOSIS — E782 Mixed hyperlipidemia: Secondary | ICD-10-CM | POA: Diagnosis not present

## 2018-10-24 DIAGNOSIS — J4521 Mild intermittent asthma with (acute) exacerbation: Secondary | ICD-10-CM | POA: Diagnosis not present

## 2018-10-25 DIAGNOSIS — E119 Type 2 diabetes mellitus without complications: Secondary | ICD-10-CM | POA: Diagnosis not present

## 2018-10-25 DIAGNOSIS — R109 Unspecified abdominal pain: Secondary | ICD-10-CM | POA: Diagnosis not present

## 2018-10-25 DIAGNOSIS — I10 Essential (primary) hypertension: Secondary | ICD-10-CM | POA: Diagnosis not present

## 2018-10-25 DIAGNOSIS — R740 Nonspecific elevation of levels of transaminase and lactic acid dehydrogenase [LDH]: Secondary | ICD-10-CM | POA: Diagnosis not present

## 2018-10-25 DIAGNOSIS — R000 Tachycardia, unspecified: Secondary | ICD-10-CM | POA: Diagnosis not present

## 2018-10-25 DIAGNOSIS — R195 Other fecal abnormalities: Secondary | ICD-10-CM | POA: Diagnosis not present

## 2018-10-25 DIAGNOSIS — R410 Disorientation, unspecified: Secondary | ICD-10-CM | POA: Diagnosis not present

## 2018-10-25 DIAGNOSIS — R58 Hemorrhage, not elsewhere classified: Secondary | ICD-10-CM | POA: Diagnosis not present

## 2018-10-25 DIAGNOSIS — R404 Transient alteration of awareness: Secondary | ICD-10-CM | POA: Diagnosis not present

## 2018-10-25 DIAGNOSIS — R531 Weakness: Secondary | ICD-10-CM | POA: Diagnosis not present

## 2018-10-25 DIAGNOSIS — I4891 Unspecified atrial fibrillation: Secondary | ICD-10-CM | POA: Diagnosis not present

## 2018-10-25 DIAGNOSIS — K760 Fatty (change of) liver, not elsewhere classified: Secondary | ICD-10-CM | POA: Diagnosis not present

## 2018-10-25 DIAGNOSIS — K5730 Diverticulosis of large intestine without perforation or abscess without bleeding: Secondary | ICD-10-CM | POA: Diagnosis not present

## 2018-10-25 DIAGNOSIS — R1010 Upper abdominal pain, unspecified: Secondary | ICD-10-CM | POA: Diagnosis not present

## 2018-10-25 DIAGNOSIS — R0602 Shortness of breath: Secondary | ICD-10-CM | POA: Diagnosis not present

## 2018-10-25 DIAGNOSIS — I252 Old myocardial infarction: Secondary | ICD-10-CM | POA: Diagnosis not present

## 2018-10-25 DIAGNOSIS — Z8673 Personal history of transient ischemic attack (TIA), and cerebral infarction without residual deficits: Secondary | ICD-10-CM | POA: Diagnosis not present

## 2018-10-25 DIAGNOSIS — R1110 Vomiting, unspecified: Secondary | ICD-10-CM | POA: Diagnosis not present

## 2018-11-22 DIAGNOSIS — J4521 Mild intermittent asthma with (acute) exacerbation: Secondary | ICD-10-CM | POA: Diagnosis not present

## 2018-12-15 DIAGNOSIS — F419 Anxiety disorder, unspecified: Secondary | ICD-10-CM | POA: Diagnosis not present

## 2018-12-15 DIAGNOSIS — R413 Other amnesia: Secondary | ICD-10-CM | POA: Diagnosis not present

## 2018-12-15 DIAGNOSIS — R630 Anorexia: Secondary | ICD-10-CM | POA: Diagnosis not present

## 2018-12-15 DIAGNOSIS — K219 Gastro-esophageal reflux disease without esophagitis: Secondary | ICD-10-CM | POA: Diagnosis not present

## 2018-12-15 DIAGNOSIS — F330 Major depressive disorder, recurrent, mild: Secondary | ICD-10-CM | POA: Diagnosis not present

## 2018-12-15 DIAGNOSIS — E782 Mixed hyperlipidemia: Secondary | ICD-10-CM | POA: Diagnosis not present

## 2018-12-15 DIAGNOSIS — E1142 Type 2 diabetes mellitus with diabetic polyneuropathy: Secondary | ICD-10-CM | POA: Diagnosis not present

## 2018-12-15 DIAGNOSIS — I69354 Hemiplegia and hemiparesis following cerebral infarction affecting left non-dominant side: Secondary | ICD-10-CM | POA: Diagnosis not present

## 2018-12-15 DIAGNOSIS — E785 Hyperlipidemia, unspecified: Secondary | ICD-10-CM | POA: Diagnosis not present

## 2018-12-15 DIAGNOSIS — I10 Essential (primary) hypertension: Secondary | ICD-10-CM | POA: Diagnosis not present

## 2018-12-17 DIAGNOSIS — E1142 Type 2 diabetes mellitus with diabetic polyneuropathy: Secondary | ICD-10-CM | POA: Diagnosis not present

## 2018-12-17 DIAGNOSIS — I69354 Hemiplegia and hemiparesis following cerebral infarction affecting left non-dominant side: Secondary | ICD-10-CM | POA: Diagnosis not present

## 2018-12-17 DIAGNOSIS — R630 Anorexia: Secondary | ICD-10-CM | POA: Diagnosis not present

## 2018-12-17 DIAGNOSIS — R413 Other amnesia: Secondary | ICD-10-CM | POA: Diagnosis not present

## 2018-12-17 DIAGNOSIS — F330 Major depressive disorder, recurrent, mild: Secondary | ICD-10-CM | POA: Diagnosis not present

## 2018-12-17 DIAGNOSIS — I10 Essential (primary) hypertension: Secondary | ICD-10-CM | POA: Diagnosis not present

## 2018-12-17 DIAGNOSIS — F419 Anxiety disorder, unspecified: Secondary | ICD-10-CM | POA: Diagnosis not present

## 2018-12-17 DIAGNOSIS — K219 Gastro-esophageal reflux disease without esophagitis: Secondary | ICD-10-CM | POA: Diagnosis not present

## 2018-12-17 DIAGNOSIS — E785 Hyperlipidemia, unspecified: Secondary | ICD-10-CM | POA: Diagnosis not present

## 2018-12-23 DIAGNOSIS — J4521 Mild intermittent asthma with (acute) exacerbation: Secondary | ICD-10-CM | POA: Diagnosis not present

## 2018-12-28 DIAGNOSIS — E785 Hyperlipidemia, unspecified: Secondary | ICD-10-CM | POA: Diagnosis not present

## 2018-12-28 DIAGNOSIS — E1142 Type 2 diabetes mellitus with diabetic polyneuropathy: Secondary | ICD-10-CM | POA: Diagnosis not present

## 2018-12-28 DIAGNOSIS — F419 Anxiety disorder, unspecified: Secondary | ICD-10-CM | POA: Diagnosis not present

## 2018-12-28 DIAGNOSIS — R413 Other amnesia: Secondary | ICD-10-CM | POA: Diagnosis not present

## 2018-12-28 DIAGNOSIS — F330 Major depressive disorder, recurrent, mild: Secondary | ICD-10-CM | POA: Diagnosis not present

## 2018-12-28 DIAGNOSIS — I69354 Hemiplegia and hemiparesis following cerebral infarction affecting left non-dominant side: Secondary | ICD-10-CM | POA: Diagnosis not present

## 2018-12-28 DIAGNOSIS — I10 Essential (primary) hypertension: Secondary | ICD-10-CM | POA: Diagnosis not present

## 2018-12-28 DIAGNOSIS — R630 Anorexia: Secondary | ICD-10-CM | POA: Diagnosis not present

## 2018-12-28 DIAGNOSIS — K219 Gastro-esophageal reflux disease without esophagitis: Secondary | ICD-10-CM | POA: Diagnosis not present

## 2018-12-29 DIAGNOSIS — I69354 Hemiplegia and hemiparesis following cerebral infarction affecting left non-dominant side: Secondary | ICD-10-CM | POA: Diagnosis not present

## 2018-12-29 DIAGNOSIS — R413 Other amnesia: Secondary | ICD-10-CM | POA: Diagnosis not present

## 2018-12-29 DIAGNOSIS — E1142 Type 2 diabetes mellitus with diabetic polyneuropathy: Secondary | ICD-10-CM | POA: Diagnosis not present

## 2019-01-05 DIAGNOSIS — F419 Anxiety disorder, unspecified: Secondary | ICD-10-CM | POA: Diagnosis not present

## 2019-01-05 DIAGNOSIS — E785 Hyperlipidemia, unspecified: Secondary | ICD-10-CM | POA: Diagnosis not present

## 2019-01-05 DIAGNOSIS — R413 Other amnesia: Secondary | ICD-10-CM | POA: Diagnosis not present

## 2019-01-05 DIAGNOSIS — I69354 Hemiplegia and hemiparesis following cerebral infarction affecting left non-dominant side: Secondary | ICD-10-CM | POA: Diagnosis not present

## 2019-01-05 DIAGNOSIS — K219 Gastro-esophageal reflux disease without esophagitis: Secondary | ICD-10-CM | POA: Diagnosis not present

## 2019-01-05 DIAGNOSIS — F330 Major depressive disorder, recurrent, mild: Secondary | ICD-10-CM | POA: Diagnosis not present

## 2019-01-05 DIAGNOSIS — I10 Essential (primary) hypertension: Secondary | ICD-10-CM | POA: Diagnosis not present

## 2019-01-05 DIAGNOSIS — E1142 Type 2 diabetes mellitus with diabetic polyneuropathy: Secondary | ICD-10-CM | POA: Diagnosis not present

## 2019-01-05 DIAGNOSIS — R630 Anorexia: Secondary | ICD-10-CM | POA: Diagnosis not present

## 2019-01-12 DIAGNOSIS — R413 Other amnesia: Secondary | ICD-10-CM | POA: Diagnosis not present

## 2019-01-12 DIAGNOSIS — E1142 Type 2 diabetes mellitus with diabetic polyneuropathy: Secondary | ICD-10-CM | POA: Diagnosis not present

## 2019-01-12 DIAGNOSIS — I69354 Hemiplegia and hemiparesis following cerebral infarction affecting left non-dominant side: Secondary | ICD-10-CM | POA: Diagnosis not present

## 2019-01-18 DIAGNOSIS — F330 Major depressive disorder, recurrent, mild: Secondary | ICD-10-CM | POA: Diagnosis not present

## 2019-01-18 DIAGNOSIS — I69354 Hemiplegia and hemiparesis following cerebral infarction affecting left non-dominant side: Secondary | ICD-10-CM | POA: Diagnosis not present

## 2019-01-18 DIAGNOSIS — R413 Other amnesia: Secondary | ICD-10-CM | POA: Diagnosis not present

## 2019-01-18 DIAGNOSIS — R630 Anorexia: Secondary | ICD-10-CM | POA: Diagnosis not present

## 2019-01-18 DIAGNOSIS — E785 Hyperlipidemia, unspecified: Secondary | ICD-10-CM | POA: Diagnosis not present

## 2019-01-18 DIAGNOSIS — F419 Anxiety disorder, unspecified: Secondary | ICD-10-CM | POA: Diagnosis not present

## 2019-01-18 DIAGNOSIS — I10 Essential (primary) hypertension: Secondary | ICD-10-CM | POA: Diagnosis not present

## 2019-01-18 DIAGNOSIS — E1142 Type 2 diabetes mellitus with diabetic polyneuropathy: Secondary | ICD-10-CM | POA: Diagnosis not present

## 2019-01-18 DIAGNOSIS — K219 Gastro-esophageal reflux disease without esophagitis: Secondary | ICD-10-CM | POA: Diagnosis not present

## 2019-01-22 DIAGNOSIS — J4521 Mild intermittent asthma with (acute) exacerbation: Secondary | ICD-10-CM | POA: Diagnosis not present

## 2019-01-25 DIAGNOSIS — R630 Anorexia: Secondary | ICD-10-CM | POA: Diagnosis not present

## 2019-01-25 DIAGNOSIS — R413 Other amnesia: Secondary | ICD-10-CM | POA: Diagnosis not present

## 2019-01-25 DIAGNOSIS — I10 Essential (primary) hypertension: Secondary | ICD-10-CM | POA: Diagnosis not present

## 2019-01-25 DIAGNOSIS — K219 Gastro-esophageal reflux disease without esophagitis: Secondary | ICD-10-CM | POA: Diagnosis not present

## 2019-01-25 DIAGNOSIS — E785 Hyperlipidemia, unspecified: Secondary | ICD-10-CM | POA: Diagnosis not present

## 2019-01-25 DIAGNOSIS — F330 Major depressive disorder, recurrent, mild: Secondary | ICD-10-CM | POA: Diagnosis not present

## 2019-01-25 DIAGNOSIS — I69354 Hemiplegia and hemiparesis following cerebral infarction affecting left non-dominant side: Secondary | ICD-10-CM | POA: Diagnosis not present

## 2019-01-25 DIAGNOSIS — F419 Anxiety disorder, unspecified: Secondary | ICD-10-CM | POA: Diagnosis not present

## 2019-01-25 DIAGNOSIS — E1142 Type 2 diabetes mellitus with diabetic polyneuropathy: Secondary | ICD-10-CM | POA: Diagnosis not present

## 2019-02-07 DIAGNOSIS — R413 Other amnesia: Secondary | ICD-10-CM | POA: Diagnosis not present

## 2019-02-07 DIAGNOSIS — F419 Anxiety disorder, unspecified: Secondary | ICD-10-CM | POA: Diagnosis not present

## 2019-02-07 DIAGNOSIS — E785 Hyperlipidemia, unspecified: Secondary | ICD-10-CM | POA: Diagnosis not present

## 2019-02-07 DIAGNOSIS — K219 Gastro-esophageal reflux disease without esophagitis: Secondary | ICD-10-CM | POA: Diagnosis not present

## 2019-02-07 DIAGNOSIS — R630 Anorexia: Secondary | ICD-10-CM | POA: Diagnosis not present

## 2019-02-07 DIAGNOSIS — I10 Essential (primary) hypertension: Secondary | ICD-10-CM | POA: Diagnosis not present

## 2019-02-07 DIAGNOSIS — F330 Major depressive disorder, recurrent, mild: Secondary | ICD-10-CM | POA: Diagnosis not present

## 2019-02-07 DIAGNOSIS — I69354 Hemiplegia and hemiparesis following cerebral infarction affecting left non-dominant side: Secondary | ICD-10-CM | POA: Diagnosis not present

## 2019-02-07 DIAGNOSIS — E1142 Type 2 diabetes mellitus with diabetic polyneuropathy: Secondary | ICD-10-CM | POA: Diagnosis not present

## 2019-02-22 DIAGNOSIS — J4521 Mild intermittent asthma with (acute) exacerbation: Secondary | ICD-10-CM | POA: Diagnosis not present

## 2019-02-26 DIAGNOSIS — Z8673 Personal history of transient ischemic attack (TIA), and cerebral infarction without residual deficits: Secondary | ICD-10-CM | POA: Diagnosis not present

## 2019-02-26 DIAGNOSIS — J449 Chronic obstructive pulmonary disease, unspecified: Secondary | ICD-10-CM | POA: Diagnosis not present

## 2019-02-26 DIAGNOSIS — I252 Old myocardial infarction: Secondary | ICD-10-CM | POA: Diagnosis not present

## 2019-02-26 DIAGNOSIS — Z87891 Personal history of nicotine dependence: Secondary | ICD-10-CM | POA: Diagnosis not present

## 2019-02-26 DIAGNOSIS — R531 Weakness: Secondary | ICD-10-CM | POA: Diagnosis not present

## 2019-02-26 DIAGNOSIS — I10 Essential (primary) hypertension: Secondary | ICD-10-CM | POA: Diagnosis not present

## 2019-02-26 DIAGNOSIS — R112 Nausea with vomiting, unspecified: Secondary | ICD-10-CM | POA: Diagnosis not present

## 2019-02-26 DIAGNOSIS — I4891 Unspecified atrial fibrillation: Secondary | ICD-10-CM | POA: Diagnosis not present

## 2020-01-08 DIAGNOSIS — E119 Type 2 diabetes mellitus without complications: Secondary | ICD-10-CM | POA: Diagnosis not present

## 2020-01-08 DIAGNOSIS — R0609 Other forms of dyspnea: Secondary | ICD-10-CM | POA: Diagnosis not present

## 2020-01-08 DIAGNOSIS — R0600 Dyspnea, unspecified: Secondary | ICD-10-CM | POA: Diagnosis not present

## 2020-01-08 DIAGNOSIS — Z79899 Other long term (current) drug therapy: Secondary | ICD-10-CM | POA: Diagnosis not present

## 2020-01-09 DIAGNOSIS — E119 Type 2 diabetes mellitus without complications: Secondary | ICD-10-CM | POA: Diagnosis not present

## 2020-01-15 DIAGNOSIS — I447 Left bundle-branch block, unspecified: Secondary | ICD-10-CM | POA: Diagnosis not present

## 2020-01-18 DIAGNOSIS — D62 Acute posthemorrhagic anemia: Secondary | ICD-10-CM | POA: Diagnosis not present

## 2020-01-18 DIAGNOSIS — D692 Other nonthrombocytopenic purpura: Secondary | ICD-10-CM | POA: Diagnosis not present

## 2020-01-18 DIAGNOSIS — Z741 Need for assistance with personal care: Secondary | ICD-10-CM | POA: Diagnosis not present

## 2020-01-18 DIAGNOSIS — M25551 Pain in right hip: Secondary | ICD-10-CM | POA: Diagnosis not present

## 2020-01-18 DIAGNOSIS — S72041A Displaced fracture of base of neck of right femur, initial encounter for closed fracture: Secondary | ICD-10-CM | POA: Diagnosis not present

## 2020-01-18 DIAGNOSIS — K219 Gastro-esophageal reflux disease without esophagitis: Secondary | ICD-10-CM | POA: Diagnosis not present

## 2020-01-18 DIAGNOSIS — G3184 Mild cognitive impairment, so stated: Secondary | ICD-10-CM | POA: Diagnosis not present

## 2020-01-18 DIAGNOSIS — R279 Unspecified lack of coordination: Secondary | ICD-10-CM | POA: Diagnosis not present

## 2020-01-18 DIAGNOSIS — D6869 Other thrombophilia: Secondary | ICD-10-CM | POA: Diagnosis not present

## 2020-01-18 DIAGNOSIS — Z7401 Bed confinement status: Secondary | ICD-10-CM | POA: Diagnosis not present

## 2020-01-18 DIAGNOSIS — Z471 Aftercare following joint replacement surgery: Secondary | ICD-10-CM | POA: Diagnosis not present

## 2020-01-18 DIAGNOSIS — M6282 Rhabdomyolysis: Secondary | ICD-10-CM | POA: Diagnosis not present

## 2020-01-18 DIAGNOSIS — M1990 Unspecified osteoarthritis, unspecified site: Secondary | ICD-10-CM | POA: Diagnosis not present

## 2020-01-18 DIAGNOSIS — D649 Anemia, unspecified: Secondary | ICD-10-CM | POA: Diagnosis not present

## 2020-01-18 DIAGNOSIS — I361 Nonrheumatic tricuspid (valve) insufficiency: Secondary | ICD-10-CM | POA: Diagnosis not present

## 2020-01-18 DIAGNOSIS — R262 Difficulty in walking, not elsewhere classified: Secondary | ICD-10-CM | POA: Diagnosis not present

## 2020-01-18 DIAGNOSIS — I482 Chronic atrial fibrillation, unspecified: Secondary | ICD-10-CM | POA: Diagnosis not present

## 2020-01-18 DIAGNOSIS — W19XXXA Unspecified fall, initial encounter: Secondary | ICD-10-CM | POA: Diagnosis not present

## 2020-01-18 DIAGNOSIS — W19XXXD Unspecified fall, subsequent encounter: Secondary | ICD-10-CM | POA: Diagnosis not present

## 2020-01-18 DIAGNOSIS — S72001A Fracture of unspecified part of neck of right femur, initial encounter for closed fracture: Secondary | ICD-10-CM | POA: Diagnosis not present

## 2020-01-18 DIAGNOSIS — M810 Age-related osteoporosis without current pathological fracture: Secondary | ICD-10-CM | POA: Diagnosis not present

## 2020-01-18 DIAGNOSIS — E440 Moderate protein-calorie malnutrition: Secondary | ICD-10-CM | POA: Diagnosis not present

## 2020-01-18 DIAGNOSIS — R0602 Shortness of breath: Secondary | ICD-10-CM | POA: Diagnosis not present

## 2020-01-18 DIAGNOSIS — I4891 Unspecified atrial fibrillation: Secondary | ICD-10-CM | POA: Diagnosis not present

## 2020-01-18 DIAGNOSIS — J449 Chronic obstructive pulmonary disease, unspecified: Secondary | ICD-10-CM | POA: Diagnosis not present

## 2020-01-18 DIAGNOSIS — Z79899 Other long term (current) drug therapy: Secondary | ICD-10-CM | POA: Diagnosis not present

## 2020-01-18 DIAGNOSIS — R269 Unspecified abnormalities of gait and mobility: Secondary | ICD-10-CM | POA: Diagnosis not present

## 2020-01-18 DIAGNOSIS — Z8673 Personal history of transient ischemic attack (TIA), and cerebral infarction without residual deficits: Secondary | ICD-10-CM | POA: Diagnosis not present

## 2020-01-18 DIAGNOSIS — I959 Hypotension, unspecified: Secondary | ICD-10-CM | POA: Diagnosis not present

## 2020-01-18 DIAGNOSIS — R52 Pain, unspecified: Secondary | ICD-10-CM | POA: Diagnosis not present

## 2020-01-18 DIAGNOSIS — S72001D Fracture of unspecified part of neck of right femur, subsequent encounter for closed fracture with routine healing: Secondary | ICD-10-CM | POA: Diagnosis not present

## 2020-01-18 DIAGNOSIS — T796XXA Traumatic ischemia of muscle, initial encounter: Secondary | ICD-10-CM | POA: Diagnosis not present

## 2020-01-18 DIAGNOSIS — I10 Essential (primary) hypertension: Secondary | ICD-10-CM | POA: Diagnosis not present

## 2020-01-18 DIAGNOSIS — E119 Type 2 diabetes mellitus without complications: Secondary | ICD-10-CM | POA: Diagnosis not present

## 2020-01-18 DIAGNOSIS — I252 Old myocardial infarction: Secondary | ICD-10-CM | POA: Diagnosis not present

## 2020-01-18 DIAGNOSIS — J45909 Unspecified asthma, uncomplicated: Secondary | ICD-10-CM | POA: Diagnosis not present

## 2020-01-18 DIAGNOSIS — Z87891 Personal history of nicotine dependence: Secondary | ICD-10-CM | POA: Diagnosis not present

## 2020-01-18 DIAGNOSIS — R410 Disorientation, unspecified: Secondary | ICD-10-CM | POA: Diagnosis not present

## 2020-01-18 DIAGNOSIS — M1810 Unilateral primary osteoarthritis of first carpometacarpal joint, unspecified hand: Secondary | ICD-10-CM | POA: Diagnosis not present

## 2020-01-18 DIAGNOSIS — I679 Cerebrovascular disease, unspecified: Secondary | ICD-10-CM | POA: Diagnosis not present

## 2020-01-18 DIAGNOSIS — S72011A Unspecified intracapsular fracture of right femur, initial encounter for closed fracture: Secondary | ICD-10-CM | POA: Diagnosis not present

## 2020-01-19 DIAGNOSIS — I361 Nonrheumatic tricuspid (valve) insufficiency: Secondary | ICD-10-CM

## 2020-01-24 DIAGNOSIS — E440 Moderate protein-calorie malnutrition: Secondary | ICD-10-CM | POA: Diagnosis not present

## 2020-01-24 DIAGNOSIS — R7989 Other specified abnormal findings of blood chemistry: Secondary | ICD-10-CM | POA: Diagnosis not present

## 2020-01-24 DIAGNOSIS — W19XXXD Unspecified fall, subsequent encounter: Secondary | ICD-10-CM | POA: Diagnosis not present

## 2020-01-24 DIAGNOSIS — Z741 Need for assistance with personal care: Secondary | ICD-10-CM | POA: Diagnosis not present

## 2020-01-24 DIAGNOSIS — Z471 Aftercare following joint replacement surgery: Secondary | ICD-10-CM | POA: Diagnosis not present

## 2020-01-24 DIAGNOSIS — S72001A Fracture of unspecified part of neck of right femur, initial encounter for closed fracture: Secondary | ICD-10-CM | POA: Diagnosis not present

## 2020-01-24 DIAGNOSIS — D692 Other nonthrombocytopenic purpura: Secondary | ICD-10-CM | POA: Diagnosis not present

## 2020-01-24 DIAGNOSIS — E119 Type 2 diabetes mellitus without complications: Secondary | ICD-10-CM | POA: Diagnosis not present

## 2020-01-24 DIAGNOSIS — M6282 Rhabdomyolysis: Secondary | ICD-10-CM | POA: Diagnosis not present

## 2020-01-24 DIAGNOSIS — G3184 Mild cognitive impairment, so stated: Secondary | ICD-10-CM | POA: Diagnosis not present

## 2020-01-24 DIAGNOSIS — M1810 Unilateral primary osteoarthritis of first carpometacarpal joint, unspecified hand: Secondary | ICD-10-CM | POA: Diagnosis not present

## 2020-01-24 DIAGNOSIS — I4891 Unspecified atrial fibrillation: Secondary | ICD-10-CM | POA: Diagnosis not present

## 2020-01-24 DIAGNOSIS — D649 Anemia, unspecified: Secondary | ICD-10-CM | POA: Diagnosis not present

## 2020-01-24 DIAGNOSIS — R279 Unspecified lack of coordination: Secondary | ICD-10-CM | POA: Diagnosis not present

## 2020-01-24 DIAGNOSIS — I679 Cerebrovascular disease, unspecified: Secondary | ICD-10-CM | POA: Diagnosis not present

## 2020-01-24 DIAGNOSIS — R269 Unspecified abnormalities of gait and mobility: Secondary | ICD-10-CM | POA: Diagnosis not present

## 2020-01-24 DIAGNOSIS — R262 Difficulty in walking, not elsewhere classified: Secondary | ICD-10-CM | POA: Diagnosis not present

## 2020-01-24 DIAGNOSIS — J45909 Unspecified asthma, uncomplicated: Secondary | ICD-10-CM | POA: Diagnosis not present

## 2020-01-24 DIAGNOSIS — M25551 Pain in right hip: Secondary | ICD-10-CM | POA: Diagnosis not present

## 2020-01-24 DIAGNOSIS — J449 Chronic obstructive pulmonary disease, unspecified: Secondary | ICD-10-CM | POA: Diagnosis not present

## 2020-01-24 DIAGNOSIS — S72001D Fracture of unspecified part of neck of right femur, subsequent encounter for closed fracture with routine healing: Secondary | ICD-10-CM | POA: Diagnosis not present

## 2020-01-24 DIAGNOSIS — I959 Hypotension, unspecified: Secondary | ICD-10-CM | POA: Diagnosis not present

## 2020-01-24 DIAGNOSIS — K219 Gastro-esophageal reflux disease without esophagitis: Secondary | ICD-10-CM | POA: Diagnosis not present

## 2020-01-24 DIAGNOSIS — D6869 Other thrombophilia: Secondary | ICD-10-CM | POA: Diagnosis not present

## 2020-01-24 DIAGNOSIS — Z7401 Bed confinement status: Secondary | ICD-10-CM | POA: Diagnosis not present

## 2020-01-30 DIAGNOSIS — I679 Cerebrovascular disease, unspecified: Secondary | ICD-10-CM | POA: Diagnosis not present

## 2020-01-30 DIAGNOSIS — G3184 Mild cognitive impairment, so stated: Secondary | ICD-10-CM | POA: Diagnosis not present

## 2020-01-30 DIAGNOSIS — S72001A Fracture of unspecified part of neck of right femur, initial encounter for closed fracture: Secondary | ICD-10-CM | POA: Diagnosis not present

## 2020-01-30 DIAGNOSIS — J449 Chronic obstructive pulmonary disease, unspecified: Secondary | ICD-10-CM | POA: Diagnosis not present

## 2020-02-07 DIAGNOSIS — J449 Chronic obstructive pulmonary disease, unspecified: Secondary | ICD-10-CM | POA: Diagnosis not present

## 2020-02-07 DIAGNOSIS — G3184 Mild cognitive impairment, so stated: Secondary | ICD-10-CM | POA: Diagnosis not present

## 2020-02-07 DIAGNOSIS — I679 Cerebrovascular disease, unspecified: Secondary | ICD-10-CM | POA: Diagnosis not present

## 2020-02-07 DIAGNOSIS — S72001A Fracture of unspecified part of neck of right femur, initial encounter for closed fracture: Secondary | ICD-10-CM | POA: Diagnosis not present

## 2020-02-12 DIAGNOSIS — K219 Gastro-esophageal reflux disease without esophagitis: Secondary | ICD-10-CM | POA: Diagnosis not present

## 2020-02-12 DIAGNOSIS — Z9181 History of falling: Secondary | ICD-10-CM | POA: Diagnosis not present

## 2020-02-12 DIAGNOSIS — Z96641 Presence of right artificial hip joint: Secondary | ICD-10-CM | POA: Diagnosis not present

## 2020-02-12 DIAGNOSIS — Z8673 Personal history of transient ischemic attack (TIA), and cerebral infarction without residual deficits: Secondary | ICD-10-CM | POA: Diagnosis not present

## 2020-02-12 DIAGNOSIS — D692 Other nonthrombocytopenic purpura: Secondary | ICD-10-CM | POA: Diagnosis not present

## 2020-02-12 DIAGNOSIS — Z87891 Personal history of nicotine dependence: Secondary | ICD-10-CM | POA: Diagnosis not present

## 2020-02-12 DIAGNOSIS — M80051D Age-related osteoporosis with current pathological fracture, right femur, subsequent encounter for fracture with routine healing: Secondary | ICD-10-CM | POA: Diagnosis not present

## 2020-02-12 DIAGNOSIS — D649 Anemia, unspecified: Secondary | ICD-10-CM | POA: Diagnosis not present

## 2020-02-12 DIAGNOSIS — M1810 Unilateral primary osteoarthritis of first carpometacarpal joint, unspecified hand: Secondary | ICD-10-CM | POA: Diagnosis not present

## 2020-02-12 DIAGNOSIS — M6282 Rhabdomyolysis: Secondary | ICD-10-CM | POA: Diagnosis not present

## 2020-02-12 DIAGNOSIS — I4891 Unspecified atrial fibrillation: Secondary | ICD-10-CM | POA: Diagnosis not present

## 2020-02-12 DIAGNOSIS — E1165 Type 2 diabetes mellitus with hyperglycemia: Secondary | ICD-10-CM | POA: Diagnosis not present

## 2020-02-12 DIAGNOSIS — E440 Moderate protein-calorie malnutrition: Secondary | ICD-10-CM | POA: Diagnosis not present

## 2020-02-14 DIAGNOSIS — Z9181 History of falling: Secondary | ICD-10-CM | POA: Diagnosis not present

## 2020-02-14 DIAGNOSIS — M6282 Rhabdomyolysis: Secondary | ICD-10-CM | POA: Diagnosis not present

## 2020-02-14 DIAGNOSIS — Z87891 Personal history of nicotine dependence: Secondary | ICD-10-CM | POA: Diagnosis not present

## 2020-02-14 DIAGNOSIS — K219 Gastro-esophageal reflux disease without esophagitis: Secondary | ICD-10-CM | POA: Diagnosis not present

## 2020-02-14 DIAGNOSIS — D692 Other nonthrombocytopenic purpura: Secondary | ICD-10-CM | POA: Diagnosis not present

## 2020-02-14 DIAGNOSIS — E440 Moderate protein-calorie malnutrition: Secondary | ICD-10-CM | POA: Diagnosis not present

## 2020-02-14 DIAGNOSIS — D649 Anemia, unspecified: Secondary | ICD-10-CM | POA: Diagnosis not present

## 2020-02-14 DIAGNOSIS — Z96641 Presence of right artificial hip joint: Secondary | ICD-10-CM | POA: Diagnosis not present

## 2020-02-14 DIAGNOSIS — Z8673 Personal history of transient ischemic attack (TIA), and cerebral infarction without residual deficits: Secondary | ICD-10-CM | POA: Diagnosis not present

## 2020-02-14 DIAGNOSIS — M80051D Age-related osteoporosis with current pathological fracture, right femur, subsequent encounter for fracture with routine healing: Secondary | ICD-10-CM | POA: Diagnosis not present

## 2020-02-14 DIAGNOSIS — M1810 Unilateral primary osteoarthritis of first carpometacarpal joint, unspecified hand: Secondary | ICD-10-CM | POA: Diagnosis not present

## 2020-02-14 DIAGNOSIS — I4891 Unspecified atrial fibrillation: Secondary | ICD-10-CM | POA: Diagnosis not present

## 2020-02-14 DIAGNOSIS — E1165 Type 2 diabetes mellitus with hyperglycemia: Secondary | ICD-10-CM | POA: Diagnosis not present

## 2020-02-19 DIAGNOSIS — D649 Anemia, unspecified: Secondary | ICD-10-CM | POA: Diagnosis not present

## 2020-02-19 DIAGNOSIS — K219 Gastro-esophageal reflux disease without esophagitis: Secondary | ICD-10-CM | POA: Diagnosis not present

## 2020-02-19 DIAGNOSIS — M6282 Rhabdomyolysis: Secondary | ICD-10-CM | POA: Diagnosis not present

## 2020-02-19 DIAGNOSIS — E440 Moderate protein-calorie malnutrition: Secondary | ICD-10-CM | POA: Diagnosis not present

## 2020-02-19 DIAGNOSIS — D692 Other nonthrombocytopenic purpura: Secondary | ICD-10-CM | POA: Diagnosis not present

## 2020-02-19 DIAGNOSIS — M1810 Unilateral primary osteoarthritis of first carpometacarpal joint, unspecified hand: Secondary | ICD-10-CM | POA: Diagnosis not present

## 2020-02-19 DIAGNOSIS — Z9181 History of falling: Secondary | ICD-10-CM | POA: Diagnosis not present

## 2020-02-19 DIAGNOSIS — Z87891 Personal history of nicotine dependence: Secondary | ICD-10-CM | POA: Diagnosis not present

## 2020-02-19 DIAGNOSIS — E1165 Type 2 diabetes mellitus with hyperglycemia: Secondary | ICD-10-CM | POA: Diagnosis not present

## 2020-02-19 DIAGNOSIS — I4891 Unspecified atrial fibrillation: Secondary | ICD-10-CM | POA: Diagnosis not present

## 2020-02-19 DIAGNOSIS — Z8673 Personal history of transient ischemic attack (TIA), and cerebral infarction without residual deficits: Secondary | ICD-10-CM | POA: Diagnosis not present

## 2020-02-19 DIAGNOSIS — Z96641 Presence of right artificial hip joint: Secondary | ICD-10-CM | POA: Diagnosis not present

## 2020-02-19 DIAGNOSIS — M80051D Age-related osteoporosis with current pathological fracture, right femur, subsequent encounter for fracture with routine healing: Secondary | ICD-10-CM | POA: Diagnosis not present

## 2020-02-21 DIAGNOSIS — Z96641 Presence of right artificial hip joint: Secondary | ICD-10-CM | POA: Diagnosis not present

## 2020-02-21 DIAGNOSIS — M6282 Rhabdomyolysis: Secondary | ICD-10-CM | POA: Diagnosis not present

## 2020-02-21 DIAGNOSIS — E440 Moderate protein-calorie malnutrition: Secondary | ICD-10-CM | POA: Diagnosis not present

## 2020-02-21 DIAGNOSIS — Z8673 Personal history of transient ischemic attack (TIA), and cerebral infarction without residual deficits: Secondary | ICD-10-CM | POA: Diagnosis not present

## 2020-02-21 DIAGNOSIS — E1165 Type 2 diabetes mellitus with hyperglycemia: Secondary | ICD-10-CM | POA: Diagnosis not present

## 2020-02-21 DIAGNOSIS — D692 Other nonthrombocytopenic purpura: Secondary | ICD-10-CM | POA: Diagnosis not present

## 2020-02-21 DIAGNOSIS — M1810 Unilateral primary osteoarthritis of first carpometacarpal joint, unspecified hand: Secondary | ICD-10-CM | POA: Diagnosis not present

## 2020-02-21 DIAGNOSIS — I4891 Unspecified atrial fibrillation: Secondary | ICD-10-CM | POA: Diagnosis not present

## 2020-02-21 DIAGNOSIS — M80051D Age-related osteoporosis with current pathological fracture, right femur, subsequent encounter for fracture with routine healing: Secondary | ICD-10-CM | POA: Diagnosis not present

## 2020-02-21 DIAGNOSIS — K219 Gastro-esophageal reflux disease without esophagitis: Secondary | ICD-10-CM | POA: Diagnosis not present

## 2020-02-21 DIAGNOSIS — Z9181 History of falling: Secondary | ICD-10-CM | POA: Diagnosis not present

## 2020-02-21 DIAGNOSIS — D649 Anemia, unspecified: Secondary | ICD-10-CM | POA: Diagnosis not present

## 2020-02-21 DIAGNOSIS — Z87891 Personal history of nicotine dependence: Secondary | ICD-10-CM | POA: Diagnosis not present

## 2020-02-29 DIAGNOSIS — D692 Other nonthrombocytopenic purpura: Secondary | ICD-10-CM | POA: Diagnosis not present

## 2020-02-29 DIAGNOSIS — Z8673 Personal history of transient ischemic attack (TIA), and cerebral infarction without residual deficits: Secondary | ICD-10-CM | POA: Diagnosis not present

## 2020-02-29 DIAGNOSIS — Z96641 Presence of right artificial hip joint: Secondary | ICD-10-CM | POA: Diagnosis not present

## 2020-02-29 DIAGNOSIS — Z87891 Personal history of nicotine dependence: Secondary | ICD-10-CM | POA: Diagnosis not present

## 2020-02-29 DIAGNOSIS — K219 Gastro-esophageal reflux disease without esophagitis: Secondary | ICD-10-CM | POA: Diagnosis not present

## 2020-02-29 DIAGNOSIS — E1165 Type 2 diabetes mellitus with hyperglycemia: Secondary | ICD-10-CM | POA: Diagnosis not present

## 2020-02-29 DIAGNOSIS — I4891 Unspecified atrial fibrillation: Secondary | ICD-10-CM | POA: Diagnosis not present

## 2020-02-29 DIAGNOSIS — Z9181 History of falling: Secondary | ICD-10-CM | POA: Diagnosis not present

## 2020-02-29 DIAGNOSIS — M80051D Age-related osteoporosis with current pathological fracture, right femur, subsequent encounter for fracture with routine healing: Secondary | ICD-10-CM | POA: Diagnosis not present

## 2020-02-29 DIAGNOSIS — E440 Moderate protein-calorie malnutrition: Secondary | ICD-10-CM | POA: Diagnosis not present

## 2020-02-29 DIAGNOSIS — D649 Anemia, unspecified: Secondary | ICD-10-CM | POA: Diagnosis not present

## 2020-02-29 DIAGNOSIS — M6282 Rhabdomyolysis: Secondary | ICD-10-CM | POA: Diagnosis not present

## 2020-02-29 DIAGNOSIS — M1810 Unilateral primary osteoarthritis of first carpometacarpal joint, unspecified hand: Secondary | ICD-10-CM | POA: Diagnosis not present

## 2020-03-06 DIAGNOSIS — Z87891 Personal history of nicotine dependence: Secondary | ICD-10-CM | POA: Diagnosis not present

## 2020-03-06 DIAGNOSIS — M80051D Age-related osteoporosis with current pathological fracture, right femur, subsequent encounter for fracture with routine healing: Secondary | ICD-10-CM | POA: Diagnosis not present

## 2020-03-06 DIAGNOSIS — Z9181 History of falling: Secondary | ICD-10-CM | POA: Diagnosis not present

## 2020-03-06 DIAGNOSIS — M6282 Rhabdomyolysis: Secondary | ICD-10-CM | POA: Diagnosis not present

## 2020-03-06 DIAGNOSIS — E440 Moderate protein-calorie malnutrition: Secondary | ICD-10-CM | POA: Diagnosis not present

## 2020-03-06 DIAGNOSIS — Z8673 Personal history of transient ischemic attack (TIA), and cerebral infarction without residual deficits: Secondary | ICD-10-CM | POA: Diagnosis not present

## 2020-03-06 DIAGNOSIS — M1810 Unilateral primary osteoarthritis of first carpometacarpal joint, unspecified hand: Secondary | ICD-10-CM | POA: Diagnosis not present

## 2020-03-06 DIAGNOSIS — E1165 Type 2 diabetes mellitus with hyperglycemia: Secondary | ICD-10-CM | POA: Diagnosis not present

## 2020-03-06 DIAGNOSIS — Z96641 Presence of right artificial hip joint: Secondary | ICD-10-CM | POA: Diagnosis not present

## 2020-03-06 DIAGNOSIS — D692 Other nonthrombocytopenic purpura: Secondary | ICD-10-CM | POA: Diagnosis not present

## 2020-03-06 DIAGNOSIS — K219 Gastro-esophageal reflux disease without esophagitis: Secondary | ICD-10-CM | POA: Diagnosis not present

## 2020-03-06 DIAGNOSIS — I4891 Unspecified atrial fibrillation: Secondary | ICD-10-CM | POA: Diagnosis not present

## 2020-03-06 DIAGNOSIS — D649 Anemia, unspecified: Secondary | ICD-10-CM | POA: Diagnosis not present

## 2020-03-07 DIAGNOSIS — D649 Anemia, unspecified: Secondary | ICD-10-CM | POA: Diagnosis not present

## 2020-03-07 DIAGNOSIS — Z8673 Personal history of transient ischemic attack (TIA), and cerebral infarction without residual deficits: Secondary | ICD-10-CM | POA: Diagnosis not present

## 2020-03-07 DIAGNOSIS — K219 Gastro-esophageal reflux disease without esophagitis: Secondary | ICD-10-CM | POA: Diagnosis not present

## 2020-03-07 DIAGNOSIS — E1165 Type 2 diabetes mellitus with hyperglycemia: Secondary | ICD-10-CM | POA: Diagnosis not present

## 2020-03-07 DIAGNOSIS — Z96641 Presence of right artificial hip joint: Secondary | ICD-10-CM | POA: Diagnosis not present

## 2020-03-07 DIAGNOSIS — Z87891 Personal history of nicotine dependence: Secondary | ICD-10-CM | POA: Diagnosis not present

## 2020-03-07 DIAGNOSIS — Z9181 History of falling: Secondary | ICD-10-CM | POA: Diagnosis not present

## 2020-03-07 DIAGNOSIS — I4891 Unspecified atrial fibrillation: Secondary | ICD-10-CM | POA: Diagnosis not present

## 2020-03-07 DIAGNOSIS — E440 Moderate protein-calorie malnutrition: Secondary | ICD-10-CM | POA: Diagnosis not present

## 2020-03-07 DIAGNOSIS — M6282 Rhabdomyolysis: Secondary | ICD-10-CM | POA: Diagnosis not present

## 2020-03-07 DIAGNOSIS — D692 Other nonthrombocytopenic purpura: Secondary | ICD-10-CM | POA: Diagnosis not present

## 2020-03-07 DIAGNOSIS — M80051D Age-related osteoporosis with current pathological fracture, right femur, subsequent encounter for fracture with routine healing: Secondary | ICD-10-CM | POA: Diagnosis not present

## 2020-03-07 DIAGNOSIS — M1810 Unilateral primary osteoarthritis of first carpometacarpal joint, unspecified hand: Secondary | ICD-10-CM | POA: Diagnosis not present

## 2020-03-08 DIAGNOSIS — M80051D Age-related osteoporosis with current pathological fracture, right femur, subsequent encounter for fracture with routine healing: Secondary | ICD-10-CM | POA: Diagnosis not present

## 2020-03-08 DIAGNOSIS — E1165 Type 2 diabetes mellitus with hyperglycemia: Secondary | ICD-10-CM | POA: Diagnosis not present

## 2020-03-08 DIAGNOSIS — Z96641 Presence of right artificial hip joint: Secondary | ICD-10-CM | POA: Diagnosis not present

## 2020-03-08 DIAGNOSIS — M1810 Unilateral primary osteoarthritis of first carpometacarpal joint, unspecified hand: Secondary | ICD-10-CM | POA: Diagnosis not present

## 2020-03-08 DIAGNOSIS — Z9181 History of falling: Secondary | ICD-10-CM | POA: Diagnosis not present

## 2020-03-08 DIAGNOSIS — Z87891 Personal history of nicotine dependence: Secondary | ICD-10-CM | POA: Diagnosis not present

## 2020-03-08 DIAGNOSIS — Z8673 Personal history of transient ischemic attack (TIA), and cerebral infarction without residual deficits: Secondary | ICD-10-CM | POA: Diagnosis not present

## 2020-03-08 DIAGNOSIS — D649 Anemia, unspecified: Secondary | ICD-10-CM | POA: Diagnosis not present

## 2020-03-08 DIAGNOSIS — M6282 Rhabdomyolysis: Secondary | ICD-10-CM | POA: Diagnosis not present

## 2020-03-08 DIAGNOSIS — E440 Moderate protein-calorie malnutrition: Secondary | ICD-10-CM | POA: Diagnosis not present

## 2020-03-08 DIAGNOSIS — D692 Other nonthrombocytopenic purpura: Secondary | ICD-10-CM | POA: Diagnosis not present

## 2020-03-08 DIAGNOSIS — K219 Gastro-esophageal reflux disease without esophagitis: Secondary | ICD-10-CM | POA: Diagnosis not present

## 2020-03-08 DIAGNOSIS — I4891 Unspecified atrial fibrillation: Secondary | ICD-10-CM | POA: Diagnosis not present

## 2020-03-11 DIAGNOSIS — Z9181 History of falling: Secondary | ICD-10-CM | POA: Diagnosis not present

## 2020-03-11 DIAGNOSIS — E440 Moderate protein-calorie malnutrition: Secondary | ICD-10-CM | POA: Diagnosis not present

## 2020-03-11 DIAGNOSIS — M6282 Rhabdomyolysis: Secondary | ICD-10-CM | POA: Diagnosis not present

## 2020-03-11 DIAGNOSIS — I4891 Unspecified atrial fibrillation: Secondary | ICD-10-CM | POA: Diagnosis not present

## 2020-03-11 DIAGNOSIS — Z8673 Personal history of transient ischemic attack (TIA), and cerebral infarction without residual deficits: Secondary | ICD-10-CM | POA: Diagnosis not present

## 2020-03-11 DIAGNOSIS — D692 Other nonthrombocytopenic purpura: Secondary | ICD-10-CM | POA: Diagnosis not present

## 2020-03-11 DIAGNOSIS — D649 Anemia, unspecified: Secondary | ICD-10-CM | POA: Diagnosis not present

## 2020-03-11 DIAGNOSIS — M1810 Unilateral primary osteoarthritis of first carpometacarpal joint, unspecified hand: Secondary | ICD-10-CM | POA: Diagnosis not present

## 2020-03-11 DIAGNOSIS — E1165 Type 2 diabetes mellitus with hyperglycemia: Secondary | ICD-10-CM | POA: Diagnosis not present

## 2020-03-11 DIAGNOSIS — Z87891 Personal history of nicotine dependence: Secondary | ICD-10-CM | POA: Diagnosis not present

## 2020-03-11 DIAGNOSIS — M80051D Age-related osteoporosis with current pathological fracture, right femur, subsequent encounter for fracture with routine healing: Secondary | ICD-10-CM | POA: Diagnosis not present

## 2020-03-11 DIAGNOSIS — Z96641 Presence of right artificial hip joint: Secondary | ICD-10-CM | POA: Diagnosis not present

## 2020-03-11 DIAGNOSIS — K219 Gastro-esophageal reflux disease without esophagitis: Secondary | ICD-10-CM | POA: Diagnosis not present

## 2020-03-12 DIAGNOSIS — M6282 Rhabdomyolysis: Secondary | ICD-10-CM | POA: Diagnosis not present

## 2020-03-12 DIAGNOSIS — Z9181 History of falling: Secondary | ICD-10-CM | POA: Diagnosis not present

## 2020-03-12 DIAGNOSIS — Z8673 Personal history of transient ischemic attack (TIA), and cerebral infarction without residual deficits: Secondary | ICD-10-CM | POA: Diagnosis not present

## 2020-03-12 DIAGNOSIS — D649 Anemia, unspecified: Secondary | ICD-10-CM | POA: Diagnosis not present

## 2020-03-12 DIAGNOSIS — K219 Gastro-esophageal reflux disease without esophagitis: Secondary | ICD-10-CM | POA: Diagnosis not present

## 2020-03-12 DIAGNOSIS — Z96641 Presence of right artificial hip joint: Secondary | ICD-10-CM | POA: Diagnosis not present

## 2020-03-12 DIAGNOSIS — E440 Moderate protein-calorie malnutrition: Secondary | ICD-10-CM | POA: Diagnosis not present

## 2020-03-12 DIAGNOSIS — E1165 Type 2 diabetes mellitus with hyperglycemia: Secondary | ICD-10-CM | POA: Diagnosis not present

## 2020-03-12 DIAGNOSIS — M1810 Unilateral primary osteoarthritis of first carpometacarpal joint, unspecified hand: Secondary | ICD-10-CM | POA: Diagnosis not present

## 2020-03-12 DIAGNOSIS — D692 Other nonthrombocytopenic purpura: Secondary | ICD-10-CM | POA: Diagnosis not present

## 2020-03-12 DIAGNOSIS — I4891 Unspecified atrial fibrillation: Secondary | ICD-10-CM | POA: Diagnosis not present

## 2020-03-12 DIAGNOSIS — Z87891 Personal history of nicotine dependence: Secondary | ICD-10-CM | POA: Diagnosis not present

## 2020-03-12 DIAGNOSIS — M80051D Age-related osteoporosis with current pathological fracture, right femur, subsequent encounter for fracture with routine healing: Secondary | ICD-10-CM | POA: Diagnosis not present

## 2020-03-19 DIAGNOSIS — D692 Other nonthrombocytopenic purpura: Secondary | ICD-10-CM | POA: Diagnosis not present

## 2020-03-19 DIAGNOSIS — I4891 Unspecified atrial fibrillation: Secondary | ICD-10-CM | POA: Diagnosis not present

## 2020-03-19 DIAGNOSIS — K219 Gastro-esophageal reflux disease without esophagitis: Secondary | ICD-10-CM | POA: Diagnosis not present

## 2020-03-19 DIAGNOSIS — M6282 Rhabdomyolysis: Secondary | ICD-10-CM | POA: Diagnosis not present

## 2020-03-19 DIAGNOSIS — Z8673 Personal history of transient ischemic attack (TIA), and cerebral infarction without residual deficits: Secondary | ICD-10-CM | POA: Diagnosis not present

## 2020-03-19 DIAGNOSIS — M1810 Unilateral primary osteoarthritis of first carpometacarpal joint, unspecified hand: Secondary | ICD-10-CM | POA: Diagnosis not present

## 2020-03-19 DIAGNOSIS — Z87891 Personal history of nicotine dependence: Secondary | ICD-10-CM | POA: Diagnosis not present

## 2020-03-19 DIAGNOSIS — M80051D Age-related osteoporosis with current pathological fracture, right femur, subsequent encounter for fracture with routine healing: Secondary | ICD-10-CM | POA: Diagnosis not present

## 2020-03-19 DIAGNOSIS — Z96641 Presence of right artificial hip joint: Secondary | ICD-10-CM | POA: Diagnosis not present

## 2020-03-19 DIAGNOSIS — E1165 Type 2 diabetes mellitus with hyperglycemia: Secondary | ICD-10-CM | POA: Diagnosis not present

## 2020-03-19 DIAGNOSIS — D649 Anemia, unspecified: Secondary | ICD-10-CM | POA: Diagnosis not present

## 2020-03-19 DIAGNOSIS — Z9181 History of falling: Secondary | ICD-10-CM | POA: Diagnosis not present

## 2020-03-19 DIAGNOSIS — E440 Moderate protein-calorie malnutrition: Secondary | ICD-10-CM | POA: Diagnosis not present

## 2020-04-03 DIAGNOSIS — R262 Difficulty in walking, not elsewhere classified: Secondary | ICD-10-CM | POA: Diagnosis not present

## 2020-04-03 DIAGNOSIS — M1990 Unspecified osteoarthritis, unspecified site: Secondary | ICD-10-CM | POA: Diagnosis not present

## 2020-04-23 DIAGNOSIS — J4521 Mild intermittent asthma with (acute) exacerbation: Secondary | ICD-10-CM | POA: Diagnosis not present

## 2020-07-14 DIAGNOSIS — Z23 Encounter for immunization: Secondary | ICD-10-CM | POA: Diagnosis not present

## 2020-07-14 DIAGNOSIS — M1990 Unspecified osteoarthritis, unspecified site: Secondary | ICD-10-CM | POA: Diagnosis not present

## 2020-07-14 DIAGNOSIS — R262 Difficulty in walking, not elsewhere classified: Secondary | ICD-10-CM | POA: Diagnosis not present

## 2020-10-24 DIAGNOSIS — J4521 Mild intermittent asthma with (acute) exacerbation: Secondary | ICD-10-CM | POA: Diagnosis not present

## 2020-10-27 DIAGNOSIS — R262 Difficulty in walking, not elsewhere classified: Secondary | ICD-10-CM | POA: Diagnosis not present

## 2020-10-27 DIAGNOSIS — M1990 Unspecified osteoarthritis, unspecified site: Secondary | ICD-10-CM | POA: Diagnosis not present

## 2020-10-30 DIAGNOSIS — R262 Difficulty in walking, not elsewhere classified: Secondary | ICD-10-CM | POA: Diagnosis not present
